# Patient Record
Sex: Female | Born: 1970 | Race: Black or African American | Hispanic: No | Smoking: Never smoker
Health system: Southern US, Community
[De-identification: ages and names within clinical notes are randomized; demographics above are authoritative.]

## PROBLEM LIST (undated history)

## (undated) DIAGNOSIS — IMO0002 Reserved for concepts with insufficient information to code with codable children: Secondary | ICD-10-CM

## (undated) DIAGNOSIS — D649 Anemia, unspecified: Secondary | ICD-10-CM

## (undated) DIAGNOSIS — R87619 Unspecified abnormal cytological findings in specimens from cervix uteri: Secondary | ICD-10-CM

## (undated) DIAGNOSIS — I10 Essential (primary) hypertension: Secondary | ICD-10-CM

## (undated) HISTORY — PX: TONSILLECTOMY: SUR1361

## (undated) HISTORY — PX: KNEE SURGERY: SHX244

## (undated) HISTORY — DX: Anemia, unspecified: D64.9

## (undated) HISTORY — PX: FINGER SURGERY: SHX640

## (undated) HISTORY — PX: FOOT SURGERY: SHX648

---

## 1997-07-13 ENCOUNTER — Ambulatory Visit (HOSPITAL_COMMUNITY): Admission: RE | Admit: 1997-07-13 | Discharge: 1997-07-13 | Payer: Self-pay | Admitting: *Deleted

## 1997-08-03 ENCOUNTER — Ambulatory Visit (HOSPITAL_COMMUNITY): Admission: RE | Admit: 1997-08-03 | Discharge: 1997-08-03 | Payer: Self-pay | Admitting: *Deleted

## 1997-11-11 ENCOUNTER — Inpatient Hospital Stay (HOSPITAL_COMMUNITY): Admission: AD | Admit: 1997-11-11 | Discharge: 1997-11-14 | Payer: Self-pay | Admitting: *Deleted

## 1999-01-18 ENCOUNTER — Emergency Department (HOSPITAL_COMMUNITY): Admission: EM | Admit: 1999-01-18 | Discharge: 1999-01-18 | Payer: Self-pay | Admitting: *Deleted

## 1999-02-26 ENCOUNTER — Emergency Department (HOSPITAL_COMMUNITY): Admission: EM | Admit: 1999-02-26 | Discharge: 1999-02-26 | Payer: Self-pay | Admitting: Emergency Medicine

## 1999-02-27 ENCOUNTER — Emergency Department (HOSPITAL_COMMUNITY): Admission: EM | Admit: 1999-02-27 | Discharge: 1999-02-27 | Payer: Self-pay | Admitting: Emergency Medicine

## 1999-05-05 ENCOUNTER — Emergency Department (HOSPITAL_COMMUNITY): Admission: EM | Admit: 1999-05-05 | Discharge: 1999-05-05 | Payer: Self-pay

## 2000-01-24 ENCOUNTER — Emergency Department (HOSPITAL_COMMUNITY): Admission: EM | Admit: 2000-01-24 | Discharge: 2000-01-24 | Payer: Self-pay | Admitting: Emergency Medicine

## 2000-02-22 ENCOUNTER — Emergency Department (HOSPITAL_COMMUNITY): Admission: EM | Admit: 2000-02-22 | Discharge: 2000-02-22 | Payer: Self-pay | Admitting: *Deleted

## 2000-04-12 ENCOUNTER — Inpatient Hospital Stay (HOSPITAL_COMMUNITY): Admission: AD | Admit: 2000-04-12 | Discharge: 2000-04-12 | Payer: Self-pay | Admitting: *Deleted

## 2000-06-14 ENCOUNTER — Inpatient Hospital Stay (HOSPITAL_COMMUNITY): Admission: AD | Admit: 2000-06-14 | Discharge: 2000-06-14 | Payer: Self-pay | Admitting: *Deleted

## 2000-07-25 ENCOUNTER — Ambulatory Visit (HOSPITAL_COMMUNITY): Admission: RE | Admit: 2000-07-25 | Discharge: 2000-07-25 | Payer: Self-pay | Admitting: *Deleted

## 2000-07-25 ENCOUNTER — Encounter: Payer: Self-pay | Admitting: *Deleted

## 2000-11-21 ENCOUNTER — Encounter (INDEPENDENT_AMBULATORY_CARE_PROVIDER_SITE_OTHER): Payer: Self-pay | Admitting: *Deleted

## 2000-11-21 ENCOUNTER — Inpatient Hospital Stay (HOSPITAL_COMMUNITY): Admission: AD | Admit: 2000-11-21 | Discharge: 2000-11-24 | Payer: Self-pay | Admitting: Obstetrics and Gynecology

## 2000-11-25 ENCOUNTER — Encounter: Admission: RE | Admit: 2000-11-25 | Discharge: 2000-12-25 | Payer: Self-pay | Admitting: Obstetrics and Gynecology

## 2000-12-26 ENCOUNTER — Encounter: Admission: RE | Admit: 2000-12-26 | Discharge: 2001-01-25 | Payer: Self-pay | Admitting: Obstetrics and Gynecology

## 2001-01-26 ENCOUNTER — Encounter: Admission: RE | Admit: 2001-01-26 | Discharge: 2001-02-25 | Payer: Self-pay | Admitting: Obstetrics and Gynecology

## 2001-03-28 ENCOUNTER — Encounter: Admission: RE | Admit: 2001-03-28 | Discharge: 2001-04-27 | Payer: Self-pay | Admitting: Obstetrics and Gynecology

## 2001-05-28 ENCOUNTER — Encounter: Admission: RE | Admit: 2001-05-28 | Discharge: 2001-06-27 | Payer: Self-pay | Admitting: Obstetrics and Gynecology

## 2001-06-28 ENCOUNTER — Encounter: Admission: RE | Admit: 2001-06-28 | Discharge: 2001-07-28 | Payer: Self-pay | Admitting: Obstetrics and Gynecology

## 2001-07-08 ENCOUNTER — Emergency Department (HOSPITAL_COMMUNITY): Admission: EM | Admit: 2001-07-08 | Discharge: 2001-07-08 | Payer: Self-pay

## 2001-07-10 ENCOUNTER — Emergency Department (HOSPITAL_COMMUNITY): Admission: EM | Admit: 2001-07-10 | Discharge: 2001-07-11 | Payer: Self-pay | Admitting: Emergency Medicine

## 2001-07-11 ENCOUNTER — Encounter: Payer: Self-pay | Admitting: Emergency Medicine

## 2001-07-11 ENCOUNTER — Inpatient Hospital Stay (HOSPITAL_COMMUNITY): Admission: EM | Admit: 2001-07-11 | Discharge: 2001-07-13 | Payer: Self-pay | Admitting: Cardiovascular Disease

## 2001-08-26 ENCOUNTER — Encounter: Admission: RE | Admit: 2001-08-26 | Discharge: 2001-09-25 | Payer: Self-pay | Admitting: Obstetrics and Gynecology

## 2001-09-11 ENCOUNTER — Encounter: Admission: RE | Admit: 2001-09-11 | Discharge: 2001-12-10 | Payer: Self-pay | Admitting: Orthopedic Surgery

## 2003-06-25 ENCOUNTER — Emergency Department (HOSPITAL_COMMUNITY): Admission: EM | Admit: 2003-06-25 | Discharge: 2003-06-25 | Payer: Self-pay | Admitting: Emergency Medicine

## 2004-04-20 ENCOUNTER — Emergency Department (HOSPITAL_COMMUNITY): Admission: EM | Admit: 2004-04-20 | Discharge: 2004-04-20 | Payer: Self-pay

## 2004-08-25 ENCOUNTER — Ambulatory Visit (HOSPITAL_COMMUNITY): Admission: RE | Admit: 2004-08-25 | Discharge: 2004-08-25 | Payer: Self-pay | Admitting: Gastroenterology

## 2004-10-22 ENCOUNTER — Emergency Department (HOSPITAL_COMMUNITY): Admission: EM | Admit: 2004-10-22 | Discharge: 2004-10-22 | Payer: Self-pay | Admitting: Emergency Medicine

## 2004-12-18 ENCOUNTER — Emergency Department (HOSPITAL_COMMUNITY): Admission: EM | Admit: 2004-12-18 | Discharge: 2004-12-19 | Payer: Self-pay | Admitting: Emergency Medicine

## 2004-12-20 ENCOUNTER — Emergency Department (HOSPITAL_COMMUNITY): Admission: EM | Admit: 2004-12-20 | Discharge: 2004-12-20 | Payer: Self-pay | Admitting: Emergency Medicine

## 2009-02-03 ENCOUNTER — Emergency Department (HOSPITAL_COMMUNITY): Admission: EM | Admit: 2009-02-03 | Discharge: 2009-02-03 | Payer: Self-pay | Admitting: Emergency Medicine

## 2009-07-16 ENCOUNTER — Inpatient Hospital Stay (HOSPITAL_COMMUNITY): Admission: AD | Admit: 2009-07-16 | Discharge: 2009-07-16 | Payer: Self-pay | Admitting: Obstetrics & Gynecology

## 2009-07-16 ENCOUNTER — Ambulatory Visit: Payer: Self-pay | Admitting: Family

## 2009-11-16 ENCOUNTER — Inpatient Hospital Stay (HOSPITAL_COMMUNITY): Admission: AD | Admit: 2009-11-16 | Discharge: 2009-11-16 | Payer: Self-pay | Admitting: Obstetrics & Gynecology

## 2009-12-24 ENCOUNTER — Ambulatory Visit: Payer: Self-pay | Admitting: Family

## 2009-12-24 ENCOUNTER — Inpatient Hospital Stay (HOSPITAL_COMMUNITY): Admission: AD | Admit: 2009-12-24 | Discharge: 2009-12-24 | Payer: Self-pay | Admitting: Obstetrics and Gynecology

## 2010-01-05 ENCOUNTER — Ambulatory Visit: Payer: Self-pay | Admitting: Gynecology

## 2010-01-05 ENCOUNTER — Inpatient Hospital Stay (HOSPITAL_COMMUNITY): Admission: AD | Admit: 2010-01-05 | Discharge: 2010-01-05 | Payer: Self-pay | Admitting: Obstetrics & Gynecology

## 2010-01-31 ENCOUNTER — Inpatient Hospital Stay (HOSPITAL_COMMUNITY): Admission: AD | Admit: 2010-01-31 | Discharge: 2010-01-31 | Payer: Self-pay | Admitting: Obstetrics & Gynecology

## 2010-01-31 ENCOUNTER — Ambulatory Visit: Payer: Self-pay | Admitting: Nurse Practitioner

## 2010-03-01 ENCOUNTER — Encounter: Admission: RE | Admit: 2010-03-01 | Discharge: 2010-03-01 | Payer: Self-pay | Admitting: Family Medicine

## 2010-03-06 ENCOUNTER — Inpatient Hospital Stay (HOSPITAL_COMMUNITY): Admission: AD | Admit: 2010-03-06 | Discharge: 2010-03-06 | Payer: Self-pay | Admitting: Obstetrics & Gynecology

## 2010-03-07 ENCOUNTER — Ambulatory Visit: Payer: Self-pay | Admitting: Obstetrics and Gynecology

## 2010-03-09 ENCOUNTER — Ambulatory Visit (HOSPITAL_COMMUNITY): Admission: RE | Admit: 2010-03-09 | Discharge: 2010-03-09 | Payer: Self-pay | Admitting: Obstetrics & Gynecology

## 2010-04-06 ENCOUNTER — Ambulatory Visit: Payer: Self-pay | Admitting: Obstetrics and Gynecology

## 2010-04-27 ENCOUNTER — Ambulatory Visit: Payer: Self-pay | Admitting: Obstetrics and Gynecology

## 2010-04-28 ENCOUNTER — Encounter (INDEPENDENT_AMBULATORY_CARE_PROVIDER_SITE_OTHER): Payer: Self-pay | Admitting: *Deleted

## 2010-04-28 LAB — CONVERTED CEMR LAB: WBC, Wet Prep HPF POC: NONE SEEN

## 2010-05-22 ENCOUNTER — Encounter: Payer: Self-pay | Admitting: Family Medicine

## 2010-06-06 ENCOUNTER — Ambulatory Visit (INDEPENDENT_AMBULATORY_CARE_PROVIDER_SITE_OTHER): Payer: Self-pay | Admitting: Obstetrics & Gynecology

## 2010-06-06 DIAGNOSIS — Z01419 Encounter for gynecological examination (general) (routine) without abnormal findings: Secondary | ICD-10-CM

## 2010-06-07 ENCOUNTER — Encounter (INDEPENDENT_AMBULATORY_CARE_PROVIDER_SITE_OTHER): Payer: Self-pay | Admitting: *Deleted

## 2010-06-07 LAB — CONVERTED CEMR LAB: Yeast Wet Prep HPF POC: NONE SEEN

## 2010-07-12 LAB — URINALYSIS, ROUTINE W REFLEX MICROSCOPIC
Bilirubin Urine: NEGATIVE
Nitrite: NEGATIVE
Specific Gravity, Urine: >1.03 — ABNORMAL HIGH (ref 1.005–1.030)
Urobilinogen, UA: 0.2 mg/dL (ref 0.0–1.0)
pH: 6 (ref 5.0–8.0)

## 2010-07-12 LAB — WET PREP, GENITAL: Clue Cells Wet Prep HPF POC: NONE SEEN

## 2010-07-12 LAB — URINE MICROSCOPIC-ADD ON

## 2010-07-13 LAB — URINALYSIS, ROUTINE W REFLEX MICROSCOPIC
Glucose, UA: NEGATIVE mg/dL
Ketones, ur: NEGATIVE mg/dL
Protein, ur: NEGATIVE mg/dL
Urobilinogen, UA: 0.2 mg/dL (ref 0.0–1.0)

## 2010-07-13 LAB — GC/CHLAMYDIA PROBE AMP, GENITAL: Chlamydia, DNA Probe: NEGATIVE

## 2010-07-13 LAB — WET PREP, GENITAL: Yeast Wet Prep HPF POC: NONE SEEN

## 2010-07-13 LAB — URINE MICROSCOPIC-ADD ON

## 2010-07-14 LAB — WET PREP, GENITAL
Clue Cells Wet Prep HPF POC: NONE SEEN
Yeast Wet Prep HPF POC: NONE SEEN

## 2010-07-14 LAB — GC/CHLAMYDIA PROBE AMP, GENITAL: Chlamydia, DNA Probe: NEGATIVE

## 2010-07-16 LAB — URINALYSIS, ROUTINE W REFLEX MICROSCOPIC
Glucose, UA: NEGATIVE mg/dL
Hgb urine dipstick: NEGATIVE
Protein, ur: NEGATIVE mg/dL
Specific Gravity, Urine: 1.025 (ref 1.005–1.030)

## 2010-07-16 LAB — WET PREP, GENITAL
Trich, Wet Prep: NONE SEEN
Yeast Wet Prep HPF POC: NONE SEEN

## 2010-07-16 LAB — POCT PREGNANCY, URINE: Preg Test, Ur: NEGATIVE

## 2010-07-17 ENCOUNTER — Inpatient Hospital Stay (HOSPITAL_COMMUNITY)
Admission: AD | Admit: 2010-07-17 | Discharge: 2010-07-17 | Disposition: A | Discharge status: Home / Self Care | Payer: Medicaid Other | Source: Ambulatory Visit | Attending: Obstetrics & Gynecology | Admitting: Obstetrics & Gynecology

## 2010-07-17 DIAGNOSIS — B3731 Acute candidiasis of vulva and vagina: Secondary | ICD-10-CM | POA: Insufficient documentation

## 2010-07-17 DIAGNOSIS — N949 Unspecified condition associated with female genital organs and menstrual cycle: Secondary | ICD-10-CM

## 2010-07-17 DIAGNOSIS — B373 Candidiasis of vulva and vagina: Secondary | ICD-10-CM

## 2010-07-22 LAB — URINALYSIS, ROUTINE W REFLEX MICROSCOPIC
Glucose, UA: NEGATIVE mg/dL
Leukocytes, UA: NEGATIVE
Specific Gravity, Urine: >1.03 — ABNORMAL HIGH (ref 1.005–1.030)
pH: 6 (ref 5.0–8.0)

## 2010-07-22 LAB — URINE MICROSCOPIC-ADD ON

## 2010-07-22 LAB — GC/CHLAMYDIA PROBE AMP, GENITAL: Chlamydia, DNA Probe: NEGATIVE

## 2010-07-22 LAB — WET PREP, GENITAL

## 2010-07-22 NOTE — Progress Notes (Signed)
[name], [name]            ACCOUNT NO.:  [id number]  MEDICAL RECORD NO.:  [id number]           PATIENT TYPE:  A  LOCATION:  [facility]                   FACILITY:  [facility]  PHYSICIAN:  Maryelizabeth Kaufmann, MD  DATE OF BIRTH:  06/27/1970  DATE OF SERVICE:  06/06/2010                                 CLINIC NOTE  CHIEF COMPLAINT:  "Yeast infection symptoms."  HISTORY OF PRESENT ILLNESS:  This is a 40 year old gravida 4, para 4-0- 04, who presents with this intermittent complaint of vaginal itching without any associated discharge or odor.  The patient denies any recent sexual activity per her last clinic visit in December.  She states that she would have some of these symptoms and intermittent vulvar pruritus with the use of latex condoms, however, she says she has not had any sexual activity since December.  She denies any environmental changes such as change in lotion, soap, fabric softener, etc.  PHYSICAL EXAMINATION:  VITAL SIGNS:  Blood pressure 166/83, pulse 55, temperature 97.1, weight 169.5. GENERAL:  The patient is in no acute distress, alert, and oriented x4. GU:  Normal external genitalia.  No evidence of any erythema or marked pruritus or excoriations.  Sterile speculum exam was normal vaginal mucosa.  Cervix appeared to be normal.  No visualized lesions, closed. There was some scant discharge without any associated odor, appeared to be physiologic possibly due to a wet prep.  ASSESSMENT AND PLAN:  This is a 40 year old gravida 4, para 4-0-0-4 presenting with intermittent pruritus and vaginal discharge. Wet prep was done.  The patient was informed that this may very well be physiologic and we will accomplish her with any abnormal results and we did not do any empiric treatment due to the lack of clinical suspicion for any type of yeast infection.  There was no thick white discharge or any other concerning symptoms for that.  If her wet prep returns negative, patient  to return to clinic in June for her next well-woman checkup.          ______________________________ Maryelizabeth Kaufmann, MD    LC/MEDQ  D:  06/06/2010  T:  06/07/2010  Job:  [id number]

## 2010-08-03 ENCOUNTER — Inpatient Hospital Stay (HOSPITAL_COMMUNITY)
Admission: AD | Admit: 2010-08-03 | Discharge: 2010-08-03 | Disposition: A | Discharge status: Home / Self Care | Payer: Medicaid Other | Source: Ambulatory Visit | Attending: Obstetrics & Gynecology | Admitting: Obstetrics & Gynecology

## 2010-08-03 DIAGNOSIS — N949 Unspecified condition associated with female genital organs and menstrual cycle: Secondary | ICD-10-CM | POA: Insufficient documentation

## 2010-08-03 DIAGNOSIS — B373 Candidiasis of vulva and vagina: Secondary | ICD-10-CM | POA: Insufficient documentation

## 2010-08-03 DIAGNOSIS — B3731 Acute candidiasis of vulva and vagina: Secondary | ICD-10-CM | POA: Insufficient documentation

## 2010-08-03 LAB — WET PREP, GENITAL
Trich, Wet Prep: NONE SEEN
Yeast Wet Prep HPF POC: NONE SEEN

## 2010-08-04 LAB — WET PREP, GENITAL: Trich, Wet Prep: NONE SEEN

## 2010-08-04 LAB — URINALYSIS, ROUTINE W REFLEX MICROSCOPIC
Bilirubin Urine: NEGATIVE
Glucose, UA: NEGATIVE mg/dL
Hgb urine dipstick: NEGATIVE
Specific Gravity, Urine: 1.027 (ref 1.005–1.030)
Urobilinogen, UA: 0.2 mg/dL (ref 0.0–1.0)

## 2010-08-04 LAB — GC/CHLAMYDIA PROBE AMP, GENITAL
Chlamydia, DNA Probe: NEGATIVE
GC Probe Amp, Genital: NEGATIVE

## 2010-09-16 NOTE — Op Note (Signed)
[facility]  Patient:    [name], [name] Visit Number: [id number] MRN: [id number]          Service Type: OBS Location: 910A 9133 [id number] Attending Physician:  Wandalee Ferdinand Dictated by:   Rudy Jew Ashley Royalty, M.D. Proc. Date: 11/21/00 Admit Date:  11/21/2000 Discharge Date: 11/24/2000                             Operative Report  NOTE:                         This is being dictated after the fact, as no previous dictation has been found by transcription.  The procedure was performed over two months ago.  PREOPERATIVE DIAGNOSES:       1. Intrauterine pregnancy at 39 weeks and 2                                  days gestation.                               2. Previous cesarean section.                               3. Desire for attempt at [facility] surgical                                  sterilization.  POSTOPERATIVE DIAGNOSES:      1. Intrauterine pregnancy at 39 weeks and 2                                  days gestation.                               2. Previous cesarean section.                               3. Desire for attempt at [facility] surgical                                  sterilization.  PROCEDURES:                   1. Repeat low transverse cesarean section.                               2. Bilateral tubal sterilization procedure (left                                  [place], right [place]).  SURGEON:                      Rudy Jew. Ashley Royalty, M.D.  ANESTHESIA:                   Spinal.  FINDINGS:  A 7 lb 5 oz female.  Apgars 8 at one minute and 9 at five minutes.  Sent to the newborn nursery.  Arterial cord pH 7.34.  ESTIMATED BLOOD LOSS:         600 cc.  COMPLICATIONS:                None.  PACKS AND DRAINS:             None.  SPONGE, NEEDLE AND INSTRUMENT COUNTS:            Reported as correct x 2.  DESCRIPTION OF PROCEDURE:     The patient was taken to the operating room and placed in the  sitting position.  After adequate spinal anesthesia was administered, she was placed in the dorsal supine position, prepped and draped in the usual manner for abdominal surgery.  A Foley catheter was placed.  A Pfannenstiel incision was made down to the level of the fascia.  The fascia was then nicked with a knife and incised transversely with Mayo scissors.  The underlying rectus muscles were separated from the overlying fascia using sharp and blunt dissection.  The rectus muscles were separated in the midline, exposing the peritoneum, which was elevated with hemostats and entered atraumatically with Metzenbaum scissors.  The incision was extended longitudinally.  The uterus was identified and a bladder flap created by incising the anterior uterine serosa and sharply and bluntly dissecting the bladder inferiorly.  It was held in place with a bladder blade.  The uterus was then entered with a combination of sharp and blunt dissection.  The infant was delivered from the vertex presentation in an atraumatic manner.  The infant was suctioned.  The cord was doubly clamped, cut and the infant given immediately to the awaiting pediatrics team.  The placenta and membranes were delivered in their entirety and submitted to pathology.  The uterus was then exteriorized.  The uterus was then closed in two running layers of #1 Vicryl. The first was a running locking layer.  The second was a running, intermittently locking and imbricating layer.  Hemostasis was noted.  Attention was then turned to the tubal sterilization procedure.  The left fallopian tube was grasped with a Babcock clamp.  The mesosalpinx was opened and two #1 plain catgut sutures were used to ligate a portion in the ampullary region.  The intervening portion was completely excised and was submitted to pathology for histologic studies.  Hemostasis was noted.  The right fallopian tube was then grasped with a Babcock clamp.  An avascular  area in the ampullary portion was chosen for [employer] ligation.  A #1 plain catgut was utilized.  A second suture were placed, as well.  The intervening portion of fallopian tube was excised with Metzenbaum scissors and submitted to pathology for histologic studies.  Hemostasis was noted.  The uterus, tubes and ovaries were noted to be otherwise normal and returned to the abdominal cavity. Copious irrigation was accomplished.  The fascia was then closed with 0 Vicryl in a running fashion.  The skin was closed with staples.  The patient tolerated the procedure extremely well and was returned to the recovery room in good condition. Dictated by:   Rudy Jew Ashley Royalty, M.D. Attending Physician:  Wandalee Ferdinand DD:  02/24/01 TD:  02/26/01 Job: [id number]

## 2010-09-16 NOTE — Discharge Summary (Signed)
[facility]  Patient:    [name], [name] Visit Number: [id number] MRN: [id number]          Service Type: OBS Location: 910A 9133 01 Attending Physician:  Wandalee Ferdinand Dictated by:   Rudy Jew Ashley Royalty, M.D. Admit Date:  11/21/2000 Discharge Date: 11/24/2000                             Discharge Summary  DISCHARGE DIAGNOSES:          1. Intrauterine pregnancy at 39+ weeks                                  gestation, delivered.                               2. Previous cesarean section.                               3. Late transfer care at [facility].                               4. Desire for repeat cesarean section.                               5. Desire for attempt at permanent surgical                                  sterilization.                               6. Term birth living child.  OPERATIONS AND SPECIAL PROCEDURES:           1. Repeat low transverse cesarean section.                               2. Bilateral tubal sterilization procedure                                  (left--[place], right--[place]).  CONSULTATIONS:                None.  DISCHARGE MEDICATIONS:        1. Tylox one p.o. q.3-4h. p.r.n. pain.                               2. Ampicillin 500 mg q.i.d.                               3. Chromagen one p.o. q.d.  HISTORY AND PHYSICAL:         This is a 40 year old, gravida 4, para 3, with estimated date of confinement November 26, 2000, 39 weeks 2 days gestation by dates. Prenatal care was complicated by late transfer to [facility]. The patient stated a desire for repeat cesarean section  and tubal sterilization procedure.  HOSPITAL COURSE:              The patient was admitted to [facility]. Initial laboratory studies were drawn. On November 21, 2000 she was taken to the operating room and underwent repeat low transverse cesarean section and bilateral tubal sterilization  procedure. The procedure yielded a 7-pound 5-ounce female, Apgars 8 at one minute, 9 at five minutes and sent to newborn nursery. The patients postoperative course was complicated by an anemia which was attributed to blood loss. She also had a low-grade fever for which there was no definite focus identified. She was given antibiotics in the hospital as well as at the time of discharge. The hemoglobin was noted to be stabilized prior to discharge as well. She was discharged on November 24, 2000 afebrile and in satisfactory condition.  ACCESSORY CLINICAL FINDINGS:  Hemoglobin and hematocrit on admission were 11.0 and 31.0, respectively. Repeat values were obtained on November 22, 2000 and were 9.1 and 25.7, respectively. Hemoglobin on November 23, 2000 and November 24, 2000 was 7.4, respectively. Type and Rh revealed B positive blood. Blood cultures were obtained and revealed no growth. RPR was nonreactive.  DISPOSITION:                  The patient is to return to [facility] in four to six weeks for postoperative and postpartum evaluation. She is also to return November 26, 2000 for CBC. Dictated by:   Rudy Jew Ashley Royalty, M.D. Attending Physician:  Wandalee Ferdinand DD:  01/03/01 TD:  01/03/01 Job: [id number]

## 2010-09-16 NOTE — Consult Note (Signed)
[place]  Patient:    [name], [name] Visit Number: [id number] MRN: [id number]          Service Type: MED Location: [phone] Attending Physician:  Virgina Evener Dictated by:   Doylene Canning. Ladona Ridgel, M.D. [facility] Proc. Date: 07/11/01 Admit Date:  07/11/2001 Discharge Date: 07/13/2001   CC:         Runell Gess, M.D.  Kathrine Cords, [facility]   Consultation Report  REFERRING PHYSICIAN:  Runell Gess, M.D.  REASON FOR CONSULTATION:  Evaluation of long QT syndrome.  HISTORY OF PRESENT ILLNESS:  The patient is a very pleasant 40 year old woman with no significant past medical history, who was admitted to the hospital after experiencing palpitations and near syncope. This was in conjunction with a markedly prolonged QT. The patients initial problem began several days ago when she experienced recurrent nausea and vomiting. She went to the [facility] where she was treated with IV fluids and subsequently given Phenergan, initially IV, and then prescription strength p.o. dosing. Two days later she developed recurrent palpitations and near syncope. This occurred on multiple occasions such that she finally presented to the emergency room where she had a corrected QT interval of over 600 msec. She was admitted for additional evaluation.  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 She presently denies using cocaine. Her urine drug screen is presently pending.  FAMILY HISTORY:  Notable for an uncle and a cousin who died suddenly of unclear etiology. The uncle was 41, the cousin was 59. Both were female.  SOCIAL HISTORY:  As previously noted. She also smokes 1/4 to 1/2 packs of cigarettes a day, and drinks 3-4 beers per week.  REVIEW OF SYSTEMS:  Otherwise unremarkable. She denies any hearing or vision problems. She denies any chest pain or  shortness of breath. She denies any diarrhea or constipation. She had nausea and vomiting as previously noted. She denies any headaches. She denies any skin changes. She denies any chest pain, shortness of breath, PND, or orthopnea. She does have presyncope and palpitations as previously noted. She denies any arthritic symptoms. She denies any urinary frequency, urgency, or dysuria. She denies hematuria. She denies any weakness, numbness, or depression. She denies polyuria or polydipsia.  PHYSICAL EXAMINATION:  GENERAL:  She is a pleasant well-appearing young woman in no distress.  VITAL SIGNS:  Blood pressure today 110/64, pulse 50 and regular, weight 95 pounds, temperature 98.  HEENT:  Normocephalic, atraumatic. The pupils are equal and round. Oropharynx was moist.  NECK:  No jugular venous distention. The thyroid was not appreciably enlarged. The trachea was midline.  LUNGS:  Clear bilaterally to auscultation. There were no rales or wheezes.  CARDIOVASCULAR:  Regular bradycardia with normal S1 and S2.  ABDOMEN:  Soft, nontender, nondistended. There was no organomegaly appreciated. Bowel sounds were present.  EXTREMITIES:  No cyanosis, clubbing, or edema. Her joints demonstrated no effusions or erythema.  DIAGNOSTIC DATA:  EKG demonstrated sinus bradycardia with prolongation of the QT interval.  IMPRESSION: 1. 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 2. Bradycardia. 3. Hypokalemia with admitting potassium of 3.3.  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 The fact that she has distant relatives who died suddenly is concerning, although there are no first-degree relatives dying suddenly. I suspect the patients prolonged QT and  palpitations were secondary to the Phenergan that she received which unmasked her syndrome. For now I would recommend continued observation and daily EKGs. Off Phenergan her QT  prolongation should improve. I would also recommend low dose beta blocker if her heart rate will tolerate it. Will plan to follow her with you. Implantation of an implantable loop recorder would be a consideration in this particular patient. Dictated by:   Doylene Canning. Ladona Ridgel, M.D. [facility] Attending Physician:  Virgina Evener DD:  07/11/01 TD:  07/13/01 Job: [id number]

## 2010-09-16 NOTE — H&P (Signed)
[facility]  Patient:    [name], [name]                     MRN: [id number] Adm. Date:  11/21/00 Attending:  Fayrene Fearing A. Ashley Royalty, M.D.                         History and Physical  HISTORY:                      This is a 40 year old gravida 4, para 3 EDC November 26, 2000 at 39 weeks 2 days by dates.  Prenatal care was complicated by late transfer to [facility].  Patient states she had an AFP done which the records showed to be normal.  However, she had no one hour p.c.  She has had two previous cesarean sections.  The first was for failure to progress by Dr. Wiliam Ke.  The second one apparently a repeat cesarean section by Dr. Gaynell Face. Patient states her prenatal care has been otherwise unremarkable.  She states the desire for attempt at permanent surgical sterilization.  MEDICATIONS:                  Vitamins.  PAST MEDICAL HISTORY:         Positive for migraine headaches.  PAST SURGICAL HISTORY:        Tonsillectomy, orthopedic surgery, cesarean section x 2.  ALLERGIES:                    None.  FAMILY HISTORY:               Noncontributory.  SOCIAL HISTORY:               Patient denies use of tobacco or significant alcohol.  REVIEW OF SYSTEMS:            Noncontributory.  PHYSICAL EXAMINATION  GENERAL:                      Well-developed, well-nourished, pleasant black female in no acute distress.  VITAL SIGNS:                  Afebrile.  Vital signs stable.  SKIN:                         Warm and dry without lesions.  LYMPH:                        No supraclavicular, cervical, or inguinal adenopathy.  HEENT:                        Normocephalic.  NECK:                         Supple without thyromegaly.  CHEST:                        Lungs are clear.  CARDIAC:                      Regular rate and rhythm without murmurs, gallops, or rubs.  BREASTS:                      Deferred.  ABDOMEN:  Gravid  with a term fundal height.  Fetal heart tones were auscultated with the Doppler.  MUSCULOSKELETAL:              Full range of motion without edema, cyanosis, or CVA tenderness.  PELVIC:                       Deferred until examination under anesthesia.  IMPRESSION:                   1. Intrauterine pregnancy at 39 weeks 2 days                                  gestation.                               2. Previous cesarean section x 2.                               3. Late transfer to [facility].                               4. Desire for repeat cesarean section.                               5. Desire for attempt at permanent surgical                                  sterilization.  PLAN:                         1. Repeat low transverse cesarean section.                               2. Bilateral tubal sterilization procedure.                                  Risks, benefits, complications, and                                  alternatives were fully discussed with the                                  patient.  Permanency and failure rates of                                  tubal sterilization procedure discussed and                                  accepted.  Questions invited and answered.DD: 11/21/00 TD:  11/21/00 Job: [id number]

## 2010-09-26 ENCOUNTER — Inpatient Hospital Stay (HOSPITAL_COMMUNITY)
Admission: AD | Admit: 2010-09-26 | Discharge: 2010-09-26 | Disposition: A | Discharge status: Home / Self Care | Payer: Medicaid Other | Source: Ambulatory Visit | Attending: Obstetrics and Gynecology | Admitting: Obstetrics and Gynecology

## 2010-09-26 DIAGNOSIS — N949 Unspecified condition associated with female genital organs and menstrual cycle: Secondary | ICD-10-CM | POA: Insufficient documentation

## 2010-09-26 DIAGNOSIS — N390 Urinary tract infection, site not specified: Secondary | ICD-10-CM

## 2010-09-26 DIAGNOSIS — N72 Inflammatory disease of cervix uteri: Secondary | ICD-10-CM

## 2010-09-26 LAB — WET PREP, GENITAL
Clue Cells Wet Prep HPF POC: NONE SEEN
Yeast Wet Prep HPF POC: NONE SEEN

## 2010-09-26 LAB — URINALYSIS, ROUTINE W REFLEX MICROSCOPIC
Bilirubin Urine: NEGATIVE
Glucose, UA: NEGATIVE mg/dL
Hgb urine dipstick: NEGATIVE
Ketones, ur: 15 mg/dL — AB
Protein, ur: NEGATIVE mg/dL
Urobilinogen, UA: 1 mg/dL (ref 0.0–1.0)

## 2010-09-26 LAB — URINE MICROSCOPIC-ADD ON

## 2010-09-27 LAB — GC/CHLAMYDIA PROBE AMP, GENITAL: GC Probe Amp, Genital: NEGATIVE

## 2010-10-04 ENCOUNTER — Inpatient Hospital Stay (HOSPITAL_COMMUNITY)
Admission: AD | Admit: 2010-10-04 | Discharge: 2010-10-04 | Discharge status: Against Medical Advice | Payer: Medicaid Other | Source: Ambulatory Visit | Attending: Obstetrics and Gynecology | Admitting: Obstetrics and Gynecology

## 2010-10-04 DIAGNOSIS — N949 Unspecified condition associated with female genital organs and menstrual cycle: Secondary | ICD-10-CM | POA: Insufficient documentation

## 2010-10-04 LAB — URINALYSIS, ROUTINE W REFLEX MICROSCOPIC
Bilirubin Urine: NEGATIVE
Glucose, UA: NEGATIVE mg/dL
Ketones, ur: NEGATIVE mg/dL
Protein, ur: NEGATIVE mg/dL
Urobilinogen, UA: 0.2 mg/dL (ref 0.0–1.0)

## 2010-10-05 ENCOUNTER — Other Ambulatory Visit: Payer: Self-pay | Admitting: Obstetrics and Gynecology

## 2010-10-05 ENCOUNTER — Ambulatory Visit (INDEPENDENT_AMBULATORY_CARE_PROVIDER_SITE_OTHER): Payer: Medicaid Other | Admitting: Obstetrics and Gynecology

## 2010-10-05 DIAGNOSIS — N898 Other specified noninflammatory disorders of vagina: Secondary | ICD-10-CM

## 2010-10-05 LAB — URINE CULTURE
Colony Count: NO GROWTH
Culture  Setup Time: 201206060207
Culture: NO GROWTH

## 2010-10-05 LAB — POCT URINALYSIS DIP (DEVICE)
Bilirubin Urine: NEGATIVE
Glucose, UA: NEGATIVE mg/dL
Hgb urine dipstick: NEGATIVE
Ketones, ur: NEGATIVE mg/dL
Nitrite: NEGATIVE
Protein, ur: NEGATIVE mg/dL
Specific Gravity, Urine: >=1.03 (ref 1.005–1.030)
Urobilinogen, UA: 0.2 mg/dL (ref 0.0–1.0)
pH: 5.5 (ref 5.0–8.0)

## 2010-10-06 NOTE — Group Therapy Note (Signed)
NAME:  CORYN, MOSSO NO.:  [id number]  MEDICAL RECORD NO.:  [id number]           PATIENT TYPE:  A  LOCATION:  [facility]                   FACILITY:  [facility]  PHYSICIAN:  Argentina Donovan, MD        DATE OF BIRTH:  [birth year]  DATE OF SERVICE:  10/05/2010                                 CLINIC NOTE  The patient is a 40 year old African American female who was in the [facility]U. Maternal uncle thought she had a urinary tract infection, was treated with Sulfa and Zithromax.  She was told that they treated her because they thought she had cervicitis.  On examination, her cervix is far anterior and then behind the cervix and the cul-de-sac is markedly inflamed.  I think they thought this was the cervix.  I am not exactly sure what this is, but we did a wet prep as well as GC Chlamydia smear and I am going to start her on MetroGel and have her come back in a couple of weeks to see if that is cleared up.  It is markedly red area right in the cul-de-sac.          ______________________________ Argentina Donovan, MD    PR/MEDQ  D:  10/05/2010  T:  10/06/2010  Job:  [id number]

## 2010-10-19 ENCOUNTER — Ambulatory Visit: Payer: Medicaid Other | Admitting: Obstetrics and Gynecology

## 2010-11-01 ENCOUNTER — Inpatient Hospital Stay (HOSPITAL_COMMUNITY)
Admission: AD | Admit: 2010-11-01 | Discharge: 2010-11-01 | Disposition: A | Discharge status: Home / Self Care | Payer: Medicaid Other | Source: Ambulatory Visit | Attending: Obstetrics and Gynecology | Admitting: Obstetrics and Gynecology

## 2010-11-01 DIAGNOSIS — D259 Leiomyoma of uterus, unspecified: Secondary | ICD-10-CM | POA: Insufficient documentation

## 2010-11-01 DIAGNOSIS — N949 Unspecified condition associated with female genital organs and menstrual cycle: Secondary | ICD-10-CM | POA: Insufficient documentation

## 2010-11-01 DIAGNOSIS — B373 Candidiasis of vulva and vagina: Secondary | ICD-10-CM | POA: Insufficient documentation

## 2010-11-01 DIAGNOSIS — B3731 Acute candidiasis of vulva and vagina: Secondary | ICD-10-CM | POA: Insufficient documentation

## 2010-11-01 LAB — WET PREP, GENITAL: Yeast Wet Prep HPF POC: NONE SEEN

## 2010-11-09 ENCOUNTER — Ambulatory Visit: Payer: Medicaid Other | Admitting: Obstetrics and Gynecology

## 2010-11-11 ENCOUNTER — Emergency Department (HOSPITAL_COMMUNITY): Payer: Medicaid Other

## 2010-11-11 ENCOUNTER — Emergency Department (HOSPITAL_COMMUNITY)
Admission: EM | Admit: 2010-11-11 | Discharge: 2010-11-11 | Disposition: A | Discharge status: Home / Self Care | Payer: Medicaid Other | Attending: Emergency Medicine | Admitting: Emergency Medicine

## 2010-11-11 DIAGNOSIS — R220 Localized swelling, mass and lump, head: Secondary | ICD-10-CM | POA: Insufficient documentation

## 2010-11-11 DIAGNOSIS — S40019A Contusion of unspecified shoulder, initial encounter: Secondary | ICD-10-CM | POA: Insufficient documentation

## 2010-11-11 DIAGNOSIS — M25519 Pain in unspecified shoulder: Secondary | ICD-10-CM | POA: Insufficient documentation

## 2010-11-11 DIAGNOSIS — R51 Headache: Secondary | ICD-10-CM | POA: Insufficient documentation

## 2010-11-11 DIAGNOSIS — S1093XA Contusion of unspecified part of neck, initial encounter: Secondary | ICD-10-CM | POA: Insufficient documentation

## 2010-11-11 DIAGNOSIS — IMO0002 Reserved for concepts with insufficient information to code with codable children: Secondary | ICD-10-CM | POA: Insufficient documentation

## 2010-11-11 DIAGNOSIS — S0003XA Contusion of scalp, initial encounter: Secondary | ICD-10-CM | POA: Insufficient documentation

## 2010-11-11 DIAGNOSIS — S060X0A Concussion without loss of consciousness, initial encounter: Secondary | ICD-10-CM | POA: Insufficient documentation

## 2011-03-06 ENCOUNTER — Other Ambulatory Visit: Payer: Self-pay | Admitting: Family Medicine

## 2011-03-06 DIAGNOSIS — Z1231 Encounter for screening mammogram for malignant neoplasm of breast: Secondary | ICD-10-CM

## 2011-03-10 ENCOUNTER — Ambulatory Visit
Admission: RE | Admit: 2011-03-10 | Discharge: 2011-03-10 | Disposition: A | Discharge status: Home / Self Care | Payer: Medicaid Other | Source: Ambulatory Visit | Attending: Family Medicine | Admitting: Family Medicine

## 2011-03-10 DIAGNOSIS — Z1231 Encounter for screening mammogram for malignant neoplasm of breast: Secondary | ICD-10-CM

## 2011-04-11 ENCOUNTER — Emergency Department (HOSPITAL_BASED_OUTPATIENT_CLINIC_OR_DEPARTMENT_OTHER)
Admission: EM | Admit: 2011-04-11 | Discharge: 2011-04-11 | Disposition: A | Discharge status: Home / Self Care | Payer: Medicaid Other | Attending: Emergency Medicine | Admitting: Emergency Medicine

## 2011-04-11 ENCOUNTER — Emergency Department (INDEPENDENT_AMBULATORY_CARE_PROVIDER_SITE_OTHER): Payer: Medicaid Other

## 2011-04-11 ENCOUNTER — Encounter (HOSPITAL_BASED_OUTPATIENT_CLINIC_OR_DEPARTMENT_OTHER): Payer: Self-pay

## 2011-04-11 ENCOUNTER — Emergency Department (HOSPITAL_COMMUNITY)
Admission: EM | Admit: 2011-04-11 | Discharge: 2011-04-11 | Discharge status: Against Medical Advice | Payer: Medicaid Other | Attending: Emergency Medicine | Admitting: Emergency Medicine

## 2011-04-11 DIAGNOSIS — R110 Nausea: Secondary | ICD-10-CM | POA: Insufficient documentation

## 2011-04-11 DIAGNOSIS — R109 Unspecified abdominal pain: Secondary | ICD-10-CM | POA: Insufficient documentation

## 2011-04-11 DIAGNOSIS — Z0389 Encounter for observation for other suspected diseases and conditions ruled out: Secondary | ICD-10-CM | POA: Insufficient documentation

## 2011-04-11 DIAGNOSIS — K5289 Other specified noninfective gastroenteritis and colitis: Secondary | ICD-10-CM | POA: Insufficient documentation

## 2011-04-11 LAB — URINALYSIS, ROUTINE W REFLEX MICROSCOPIC
Bilirubin Urine: NEGATIVE
Glucose, UA: NEGATIVE mg/dL
Hgb urine dipstick: NEGATIVE
Nitrite: NEGATIVE
Nitrite: NEGATIVE
Specific Gravity, Urine: 1.016 (ref 1.005–1.030)
Specific Gravity, Urine: 1.021 (ref 1.005–1.030)
Urobilinogen, UA: 0.2 mg/dL (ref 0.0–1.0)
pH: 6.5 (ref 5.0–8.0)
pH: 5.5 (ref 5.0–8.0)

## 2011-04-11 LAB — DIFFERENTIAL
Basophils Absolute: 0 10*3/uL (ref 0.0–0.1)
Lymphocytes Relative: 25 % (ref 12–46)
Lymphs Abs: 1.6 10*3/uL (ref 0.7–4.0)
Neutro Abs: 3.8 10*3/uL (ref 1.7–7.7)

## 2011-04-11 LAB — CBC
HCT: 34.4 % — ABNORMAL LOW (ref 36.0–46.0)
Platelets: 353 10*3/uL (ref 150–400)
RBC: 4.15 MIL/uL (ref 3.87–5.11)
RDW: 13.8 % (ref 11.5–15.5)
WBC: 6.1 10*3/uL (ref 4.0–10.5)

## 2011-04-11 LAB — COMPREHENSIVE METABOLIC PANEL
ALT: 13 U/L (ref 0–35)
AST: 18 U/L (ref 0–37)
Alkaline Phosphatase: 63 U/L (ref 39–117)
CO2: 25 mEq/L (ref 19–32)
Chloride: 100 mEq/L (ref 96–112)
GFR calc non Af Amer: >90 mL/min (ref >90)
Potassium: 3.7 mEq/L (ref 3.5–5.1)
Sodium: 137 mEq/L (ref 135–145)
Total Bilirubin: 0.2 mg/dL — ABNORMAL LOW (ref 0.3–1.2)

## 2011-04-11 LAB — PREGNANCY, URINE: Preg Test, Ur: NEGATIVE

## 2011-04-11 MED ORDER — HYDROCODONE-ACETAMINOPHEN 5-325 MG PO TABS: 1.0000 | ORAL_TABLET | Freq: Four times a day (QID) | ORAL | Status: AC | PRN

## 2011-04-11 MED ORDER — IOHEXOL 300 MG/ML  SOLN
100.0000 mL | Freq: Once | INTRAMUSCULAR | Status: AC | PRN
  Administered 2011-04-11: 100 mL via INTRAVENOUS

## 2011-04-11 MED ORDER — ONDANSETRON HCL 4 MG/2ML IJ SOLN
4.0000 mg | Freq: Once | INTRAMUSCULAR | Status: AC
  Administered 2011-04-11: 4 mg via INTRAVENOUS
  Filled 2011-04-11: qty 2

## 2011-04-11 MED ORDER — SODIUM CHLORIDE 0.9 % IV SOLN
999.0000 mL | INTRAVENOUS | Status: DC
  Administered 2011-04-11: 16:00:00 via INTRAVENOUS

## 2011-04-11 MED ORDER — IOHEXOL 300 MG/ML  SOLN
18.0000 mL | Freq: Once | INTRAMUSCULAR | Status: AC | PRN
  Administered 2011-04-11: 18 mL via ORAL

## 2011-04-11 MED ORDER — NAPROXEN 500 MG PO TABS: 500.0000 mg | ORAL_TABLET | Freq: Two times a day (BID) | ORAL | Status: AC

## 2011-04-11 MED ORDER — HYDROMORPHONE HCL PF 1 MG/ML IJ SOLN
1.0000 mg | Freq: Once | INTRAMUSCULAR | Status: AC
  Administered 2011-04-11: 1 mg via INTRAVENOUS
  Filled 2011-04-11: qty 1

## 2011-04-11 NOTE — ED Notes (Signed)
Call no answer 

## 2011-04-11 NOTE — ED Notes (Signed)
Pt reports abdominal pain and nausea that started Sunday.

## 2011-04-11 NOTE — ED Notes (Signed)
Patient is resting comfortably. Pt reports pain is better.

## 2011-04-11 NOTE — ED Provider Notes (Signed)
History     CSN: [id number] Arrival date & time: 04/11/2011  3:08 PM   First MD Initiated Contact with Patient 04/11/11 1557      Chief Complaint  Patient presents with  . Abdominal Pain  . Nausea    (Consider location/radiation/quality/duration/timing/severity/associated sxs/prior treatment) Patient is a 40 y.o. female presenting with abdominal pain. The history is provided by the patient.  Abdominal Pain The primary symptoms of the illness include abdominal pain. The primary symptoms of the illness do not include fever, vomiting, diarrhea, dysuria, vaginal discharge or vaginal bleeding. The current episode started more than 2 days ago. The onset of the illness was gradual. The problem has been gradually worsening.  The abdominal pain is located in the RLQ. The abdominal pain is exacerbated by movement (not by eating).  Symptoms associated with the illness do not include chills, anorexia, urgency or hematuria.    History reviewed. No pertinent past medical history.  Past Surgical History  Procedure Date  . Tonsillectomy   . Finger surgery   . Knee surgery   . Foot surgery   . Cesarean section     No family history on file.  History  Substance Use Topics  . Smoking status: Never Smoker   . Smokeless tobacco: Not on file  . Alcohol Use: Yes    OB History    Grav Para Term Preterm Abortions TAB SAB Ect Mult Living                  Review of Systems  Constitutional: Negative for fever and chills.  Gastrointestinal: Positive for abdominal pain. Negative for vomiting, diarrhea and anorexia.  Genitourinary: Negative for dysuria, urgency, hematuria, vaginal bleeding and vaginal discharge.  All other systems reviewed and are negative.    Allergies  Phenergan  Home Medications  No current outpatient prescriptions on file.  BP 150/71  Pulse 62  Temp(Src) 98.7 F (37.1 C) (Oral)  Resp 16  Ht 5\' 2"  (1.575 m)  Wt 170 lb (77.111 kg)  BMI 31.09 kg/m2  SpO2 100%   LMP 03/16/2011  Physical Exam  Nursing note and vitals reviewed. Constitutional: She appears well-developed and well-nourished. No distress.  HENT:  Head: Normocephalic and atraumatic.  Right Ear: External ear normal.  Left Ear: External ear normal.  Eyes: Conjunctivae are normal. Right eye exhibits no discharge. Left eye exhibits no discharge. No scleral icterus.  Neck: Neck supple. No tracheal deviation present.  Cardiovascular: Normal rate, regular rhythm and intact distal pulses.   Pulmonary/Chest: Effort normal and breath sounds normal. No stridor. No respiratory distress. She has no wheezes. She has no rales.  Abdominal: Soft. Bowel sounds are normal. She exhibits no distension and no mass. There is tenderness in the right upper quadrant and right lower quadrant. There is no rigidity and no rebound. No hernia.  Musculoskeletal: She exhibits no edema and no tenderness.  Neurological: She is alert. She has normal strength. No sensory deficit. Cranial nerve deficit:  no gross defecits noted. She exhibits normal muscle tone. She displays no seizure activity. Coordination normal.  Skin: Skin is warm and dry. No rash noted.  Psychiatric: She has a normal mood and affect.    ED Course  Procedures (including critical care time)  Labs Reviewed  CBC - Abnormal; Notable for the following:    Hemoglobin 11.6 (*)    HCT 34.4 (*)    All other components within normal limits  COMPREHENSIVE METABOLIC PANEL - Abnormal; Notable for the following:  Glucose, Bld 113 (*)    Total Bilirubin 0.2 (*)    All other components within normal limits  URINALYSIS, ROUTINE W REFLEX MICROSCOPIC  PREGNANCY, URINE  DIFFERENTIAL  LIPASE, BLOOD   Ct Abdomen Pelvis W Contrast  04/11/2011  *RADIOLOGY REPORT*  Clinical Data: Right-sided abdominal pain.  CT ABDOMEN AND PELVIS WITH CONTRAST  Technique:  Multidetector CT imaging of the abdomen and pelvis was performed following the standard protocol during  bolus administration of intravenous contrast.  Contrast: 18mL OMNIPAQUE IOHEXOL 300 MG/ML IV SOLN, OMNIPAQUE IOHEXOL 300 MG/ML IV SOLN  Comparison: None.  Findings: Lung bases are clear.  No effusions.  Heart is normal size.  Liver, spleen, gallbladder, pancreas, adrenals and kidneys are normal.  Appendix is visualized posterior to and medial to the cecum and is normal.  Inflammation and stranding is noted along the lateral aspect of the cecum and proximal ascending colon, best seen on image 53 through 56.  There is a area of central fatty density.  These findings are most suggestive of epiploic appendagitis.  Small bowel is decompressed.  Uterus, adnexa and urinary bladder are unremarkable.  Aorta is normal caliber.  No free fluid, free air or adenopathy.  IMPRESSION: Mild stranding along the anterolateral aspect of the cecum and proximal ascending colon with central fatty density, most compatible with epiploic appendagitis.  Appendix is visualized and is normal, separate from the area of inflammation.  Original Report Authenticated By: Cyndie Chime, M.D.     Diagnosis: Epiploic appendagitis  MDM  Patient without signs of appendicitis on CT scan. It does show inflammation of the epiploic appendage.  Patient will be treated with anti-inflammatory agents and pain medications.        Celene Kras, MD 04/11/11 [id number]

## 2011-04-11 NOTE — ED Notes (Signed)
Pt c/o pain that began Saturday; hurts to walk. Denies vomitting, diarrhea.

## 2011-05-12 ENCOUNTER — Ambulatory Visit (INDEPENDENT_AMBULATORY_CARE_PROVIDER_SITE_OTHER): Payer: Medicaid Other | Admitting: Family Medicine

## 2011-05-12 ENCOUNTER — Encounter: Payer: Self-pay | Admitting: Family Medicine

## 2011-05-12 ENCOUNTER — Other Ambulatory Visit (HOSPITAL_COMMUNITY)
Admission: RE | Admit: 2011-05-12 | Discharge: 2011-05-12 | Disposition: A | Discharge status: Home / Self Care | Payer: Medicaid Other | Source: Ambulatory Visit | Attending: Family Medicine | Admitting: Family Medicine

## 2011-05-12 VITALS — BP 122/81 | HR 69 | Temp 97.9°F | Ht 62.5 in | Wt 169.0 lb

## 2011-05-12 DIAGNOSIS — Z113 Encounter for screening for infections with a predominantly sexual mode of transmission: Secondary | ICD-10-CM | POA: Insufficient documentation

## 2011-05-12 DIAGNOSIS — Z0000 Encounter for general adult medical examination without abnormal findings: Secondary | ICD-10-CM

## 2011-05-12 DIAGNOSIS — Z01419 Encounter for gynecological examination (general) (routine) without abnormal findings: Secondary | ICD-10-CM | POA: Insufficient documentation

## 2011-05-12 DIAGNOSIS — N760 Acute vaginitis: Secondary | ICD-10-CM | POA: Insufficient documentation

## 2011-05-12 DIAGNOSIS — B9689 Other specified bacterial agents as the cause of diseases classified elsewhere: Secondary | ICD-10-CM

## 2011-05-12 DIAGNOSIS — A499 Bacterial infection, unspecified: Secondary | ICD-10-CM

## 2011-05-12 NOTE — Progress Notes (Signed)
  Subjective:     [name] is a 41 y.o. female who presents for evaluation of vaginal itching. The itching started 5-6 months ago.  Occurs a few days prior to menses and a few days following, then gets treatment.  Antibiotics lasts for about 1 week, then returns.  Symptoms have been unchanged since. Aggravating factors: none. Alleviating factors: none. Associated symptoms: none - denies discharge, dysuria, pelvic pain. Risk factors for pelvic/abdominal pain include none.  Menstrual History: OB History    Grav Para Term Preterm Abortions TAB SAB Ect Mult Living   4 4 4       4        Patient's last menstrual period was 05/06/2011.    The following portions of the patient's history were reviewed and updated as appropriate: allergies, current medications, past family history, past medical history, past social history, past surgical history and problem list.   Review of Systems Pertinent items are noted in HPI.    Objective:    BP 122/81  Pulse 69  Temp(Src) 97.9 F (36.6 C) (Oral)  Ht 5' 2.5" (1.588 m)  Wt 169 lb (76.658 kg)  BMI 30.42 kg/m2  LMP 05/06/2011 General:   alert, cooperative and no distress  Abdomen:  soft, non-tender; bowel sounds normal; no masses,  no organomegaly  Pelvis:  Vaginal: normal mucosa without prolapse or lesions and normal rugae Cervix: normal appearance and thin prep PAP obtained Adnexa: normal bimanual exam  Extremities:   extremities normal, atraumatic, no cyanosis or edema  Neurologic:   Alert and oriented x3. Gait normal. Reflexes and motor strength normal and symmetric. Cranial nerves 2-12 and sensation grossly intact.  Psychiatric:   normal mood, behavior, speech, dress, and thought processes     Assessment:    Chronic Vaginitis    Plan:    PAP obtained.  Will preform direct DNA probe for BV.  Recommended probiotics.  If positive, will treat with antibiotics with boric acid.

## 2011-05-12 NOTE — Patient Instructions (Signed)
Bacterial Vaginosis Bacterial vaginosis (BV) is a vaginal infection where the normal balance of bacteria in the vagina is disrupted. The normal balance is then replaced by an overgrowth of certain bacteria. There are several different kinds of bacteria that can cause BV. BV is the most common vaginal infection in women of childbearing age. CAUSES   The cause of BV is not fully understood. BV develops when there is an increase or imbalance of harmful bacteria.   Some activities or behaviors can upset the normal balance of bacteria in the vagina and put women at increased risk including:   Having a new sex partner or multiple sex partners.   Douching.   Using an intrauterine device (IUD) for contraception.   It is not clear what role sexual activity plays in the development of BV. However, women that have never had sexual intercourse are rarely infected with BV.  Women do not get BV from toilet seats, bedding, swimming pools or from touching objects around them.  SYMPTOMS   Grey vaginal discharge.   A fish-like odor with discharge, especially after sexual intercourse.   Itching or burning of the vagina and vulva.   Burning or pain with urination.   Some women have no signs or symptoms at all.  DIAGNOSIS  Your caregiver must examine the vagina for signs of BV. Your caregiver will perform lab tests and look at the sample of vaginal fluid through a microscope. They will look for bacteria and abnormal cells (clue cells), a pH test higher than 4.5, and a positive amine test all associated with BV.  RISKS AND COMPLICATIONS   Pelvic inflammatory disease (PID).   Infections following gynecology surgery.   Developing HIV.   Developing herpes virus.  TREATMENT  Sometimes BV will clear up without treatment. However, all women with symptoms of BV should be treated to avoid complications, especially if gynecology surgery is planned. Female partners generally do not need to be treated. However,  BV may spread between female sex partners so treatment is helpful in preventing a recurrence of BV.   BV may be treated with antibiotics. The antibiotics come in either pill or vaginal cream forms. Either can be used with nonpregnant or pregnant women, but the recommended dosages differ. These antibiotics are not harmful to the baby.   BV can recur after treatment. If this happens, a second round of antibiotics will often be prescribed.   Treatment is important for pregnant women. If not treated, BV can cause a premature delivery, especially for a pregnant woman who had a premature birth in the past. All pregnant women who have symptoms of BV should be checked and treated.   For chronic reoccurrence of BV, treatment with a type of prescribed gel vaginally twice a week is helpful.  HOME CARE INSTRUCTIONS   Finish all medication as directed by your caregiver.   Do not have sex until treatment is completed.   Tell your sexual partner that you have a vaginal infection. They should see their caregiver and be treated if they have problems, such as a mild rash or itching.   Practice safe sex. Use condoms. Only have 1 sex partner.  PREVENTION  Basic prevention steps can help reduce the risk of upsetting the natural balance of bacteria in the vagina and developing BV:  Do not have sexual intercourse (be abstinent).   Do not douche.   Use all of the medicine prescribed for treatment of BV, even if the signs and symptoms go away.     Tell your sex partner if you have BV. That way, they can be treated, if needed, to prevent reoccurrence.  SEEK MEDICAL CARE IF:   Your symptoms are not improving after 3 days of treatment.   You have increased discharge, pain, or fever.  MAKE SURE YOU:   Understand these instructions.   Will watch your condition.   Will get help right away if you are not doing well or get worse.  FOR MORE INFORMATION  Division of STD Prevention (DSTDP), Centers for Disease  Control and Prevention: www.cdc.gov/std American Social Health Association (ASHA): www.ashastd.org  Document Released: 04/17/2005 Document Revised: 12/28/2010 Document Reviewed: 10/08/2008 [employer] Patient Information 2012 [employer]. 

## 2011-05-16 ENCOUNTER — Telehealth: Payer: Self-pay | Admitting: *Deleted

## 2011-05-16 NOTE — Telephone Encounter (Signed)
Pt left message requesting test results from visit on 05/12/11.

## 2011-05-17 NOTE — Telephone Encounter (Signed)
Telephoned pt and advised test results not back yet. It usually takes longer for those results. Pt voiced understanding.

## 2011-05-24 NOTE — Telephone Encounter (Signed)
Called pt informed of the following test results: Pap- normal, GC/Chlamydia- neg, Trich- neg.  Pt voiced understanding.

## 2012-04-01 ENCOUNTER — Other Ambulatory Visit: Payer: Self-pay | Admitting: Family Medicine

## 2012-04-01 DIAGNOSIS — Z1231 Encounter for screening mammogram for malignant neoplasm of breast: Secondary | ICD-10-CM

## 2012-04-12 ENCOUNTER — Inpatient Hospital Stay (HOSPITAL_COMMUNITY)
Admission: AD | Admit: 2012-04-12 | Discharge: 2012-04-12 | Disposition: A | Discharge status: Home / Self Care | Payer: Medicaid Other | Source: Ambulatory Visit | Attending: Obstetrics and Gynecology | Admitting: Obstetrics and Gynecology

## 2012-04-12 DIAGNOSIS — N949 Unspecified condition associated with female genital organs and menstrual cycle: Secondary | ICD-10-CM | POA: Insufficient documentation

## 2012-04-12 DIAGNOSIS — L293 Anogenital pruritus, unspecified: Secondary | ICD-10-CM | POA: Insufficient documentation

## 2012-04-12 NOTE — MAU Note (Signed)
Not in lobby

## 2012-04-12 NOTE — MAU Note (Signed)
Going on the past couple days. Couldn't get into reg dr,  Has a slight d/c with itching.  Last time had that  It was bacterial  Infection.    At top of crack, had an area felt like a boil was going to come up, was itching really bad on Sunday, was scratching it.  Turned red, on Mon- looked like there were pus pockets or blisters.  Put some chlorox on a cute tip, didn't burn or do anything. Put some blue star ointment on it, next day when bathing- it like- "rubbed off", blisters are gone, no longer itching- but you can tell where it was- red spots.

## 2012-04-12 NOTE — MAU Note (Signed)
Patient not in lobby for 3rd call.

## 2012-04-25 ENCOUNTER — Ambulatory Visit: Payer: Medicaid Other | Admitting: Family Medicine

## 2012-05-06 ENCOUNTER — Ambulatory Visit
Admission: RE | Admit: 2012-05-06 | Discharge: 2012-05-06 | Disposition: A | Discharge status: Home / Self Care | Payer: Medicaid Other | Source: Ambulatory Visit | Attending: Family Medicine | Admitting: Family Medicine

## 2012-05-06 DIAGNOSIS — Z1231 Encounter for screening mammogram for malignant neoplasm of breast: Secondary | ICD-10-CM

## 2012-05-28 ENCOUNTER — Inpatient Hospital Stay (HOSPITAL_COMMUNITY)
Admission: AD | Admit: 2012-05-28 | Discharge: 2012-05-28 | Disposition: A | Discharge status: Home / Self Care | Payer: Medicaid Other | Source: Ambulatory Visit | Attending: Family Medicine | Admitting: Family Medicine

## 2012-05-28 ENCOUNTER — Encounter (HOSPITAL_COMMUNITY): Payer: Self-pay | Admitting: *Deleted

## 2012-05-28 DIAGNOSIS — N949 Unspecified condition associated with female genital organs and menstrual cycle: Secondary | ICD-10-CM | POA: Insufficient documentation

## 2012-05-28 DIAGNOSIS — N898 Other specified noninflammatory disorders of vagina: Secondary | ICD-10-CM

## 2012-05-28 LAB — URINALYSIS, ROUTINE W REFLEX MICROSCOPIC
Bilirubin Urine: NEGATIVE
Specific Gravity, Urine: >1.03 — ABNORMAL HIGH (ref 1.005–1.030)
Urobilinogen, UA: 0.2 mg/dL (ref 0.0–1.0)

## 2012-05-28 LAB — WET PREP, GENITAL: Yeast Wet Prep HPF POC: NONE SEEN

## 2012-05-28 LAB — URINE MICROSCOPIC-ADD ON

## 2012-05-28 NOTE — MAU Provider Note (Signed)
History     CSN: [id number]  Arrival date and time: 05/28/12 1048   First Provider Initiated Contact with Patient 05/28/12 1114      Chief Complaint  Patient presents with  . Vaginal Discharge   HPI Ms. [name] is a 42 y.o. [phone] who presents to MAU with complaint of vaginal discharge x 2-3 days. The patient states that it is a thick white discharge. She denies itching, pain, dysuria, fever, N/V. She does not have any concern for STD exposure.   OB History    Grav Para Term Preterm Abortions TAB SAB Ect Mult Living   4 4 4       4       Past Medical History  Diagnosis Date  . Anemia     Past Surgical History  Procedure Date  . Tonsillectomy   . Finger surgery   . Knee surgery   . Foot surgery   . Cesarean section     Family History  Problem Relation Age of Onset  . Hypertension Mother   . Hypertension Father     History  Substance Use Topics  . Smoking status: Never Smoker   . Smokeless tobacco: Not on file  . Alcohol Use: Yes     Comment: weekends    Allergies:  Allergies  Allergen Reactions  . Promethazine Hcl Shortness Of Breath    Prolonged QT on ekg    Prescriptions prior to admission  Medication Sig Dispense Refill  . ibuprofen (ADVIL,MOTRIN) 600 MG tablet Take 600 mg by mouth every 6 (six) hours as needed.        ROS All negative unless otherwise noted in HPI Physical Exam   Blood pressure 123/67, pulse 73, temperature 98.4 F (36.9 C), temperature source Oral, resp. rate 16, height 5' 2.5" (1.588 m), weight 173 lb 9.6 oz (78.744 kg), last menstrual period 05/16/2012.  Physical Exam  Constitutional: She is oriented to person, place, and time. She appears well-developed and well-nourished. No distress.  HENT:  Head: Normocephalic.  Cardiovascular: Normal rate, regular rhythm and normal heart sounds.   Respiratory: Effort normal and breath sounds normal. No respiratory distress.  GI: Soft. Bowel sounds are normal. She  exhibits no distension and no mass. There is no tenderness. There is no rebound and no guarding.  Genitourinary: Vagina normal. Uterus is not enlarged and not tender. Cervix exhibits discharge (thick white discharge noted on cervix and in vaginal vault). Cervix exhibits no motion tenderness and no friability.  Neurological: She is alert and oriented to person, place, and time.  Skin: Skin is warm and dry. No erythema.  Psychiatric: She has a normal mood and affect.   Results for orders placed during the hospital encounter of 05/28/12 (from the past 24 hour(s))  URINALYSIS, ROUTINE W REFLEX MICROSCOPIC     Status: Abnormal   Collection Time   05/28/12 11:00 AM      Component Value Range   Color, Urine YELLOW  YELLOW   APPearance CLEAR  CLEAR   Specific Gravity, Urine >1.030 (*) 1.005 - 1.030   pH 6.0  5.0 - 8.0   Glucose, UA NEGATIVE  NEGATIVE mg/dL   Hgb urine dipstick TRACE (*) NEGATIVE   Bilirubin Urine NEGATIVE  NEGATIVE   Ketones, ur NEGATIVE  NEGATIVE mg/dL   Protein, ur NEGATIVE  NEGATIVE mg/dL   Urobilinogen, UA 0.2  0.0 - 1.0 mg/dL   Nitrite NEGATIVE  NEGATIVE   Leukocytes, UA NEGATIVE  NEGATIVE  URINE  MICROSCOPIC-ADD ON     Status: Abnormal   Collection Time   05/28/12 11:00 AM      Component Value Range   Squamous Epithelial / LPF FEW (*) RARE   RBC / HPF 0-2  <3 RBC/hpf   Bacteria, UA RARE  RARE  POCT PREGNANCY, URINE     Status: Normal   Collection Time   05/28/12 11:08 AM      Component Value Range   Preg Test, Ur NEGATIVE  NEGATIVE  WET PREP, GENITAL     Status: Abnormal   Collection Time   05/28/12 11:20 AM      Component Value Range   Yeast Wet Prep HPF POC NONE SEEN  NONE SEEN   Trich, Wet Prep NONE SEEN  NONE SEEN   Clue Cells Wet Prep HPF POC NONE SEEN  NONE SEEN   WBC, Wet Prep HPF POC FEW (*) NONE SEEN    MAU Course  Procedures None  MDM UA, UPT and Wet prep - negative GC/Chlamydia obtained  Assessment and Plan  A: Physiologic  discharge   P: Discharge home Discussed hygiene and probiotics to avoid BV Patient may return to MAU as needed or if her condition should change or worsen   Freddi Starr, PA-C 05/28/2012, 11:14 AM

## 2012-05-28 NOTE — Progress Notes (Signed)
Written and verbal d/c instructions given and understanding voiced. Raynelle Fanning PA in to discuss lab results and d/c plan

## 2012-05-28 NOTE — Progress Notes (Signed)
States occ. Feels alittle dizzy, like vertigo

## 2012-05-28 NOTE — MAU Note (Signed)
Onset of white vaginal discharge x 2 days, denies itching

## 2012-05-28 NOTE — MAU Provider Note (Signed)
Chart reviewed and agree with management and plan.  

## 2012-05-29 LAB — GC/CHLAMYDIA PROBE AMP: CT Probe RNA: NEGATIVE

## 2012-09-04 ENCOUNTER — Inpatient Hospital Stay (HOSPITAL_COMMUNITY)
Admission: AD | Admit: 2012-09-04 | Discharge: 2012-09-04 | Disposition: A | Discharge status: Home / Self Care | Payer: Medicaid Other | Source: Ambulatory Visit | Attending: Obstetrics & Gynecology | Admitting: Obstetrics & Gynecology

## 2012-09-04 ENCOUNTER — Encounter (HOSPITAL_COMMUNITY): Payer: Self-pay | Admitting: *Deleted

## 2012-09-04 DIAGNOSIS — N898 Other specified noninflammatory disorders of vagina: Secondary | ICD-10-CM

## 2012-09-04 DIAGNOSIS — N899 Noninflammatory disorder of vagina, unspecified: Secondary | ICD-10-CM

## 2012-09-04 DIAGNOSIS — N949 Unspecified condition associated with female genital organs and menstrual cycle: Secondary | ICD-10-CM | POA: Insufficient documentation

## 2012-09-04 DIAGNOSIS — L293 Anogenital pruritus, unspecified: Secondary | ICD-10-CM | POA: Insufficient documentation

## 2012-09-04 HISTORY — DX: Unspecified abnormal cytological findings in specimens from cervix uteri: R87.619

## 2012-09-04 HISTORY — DX: Reserved for concepts with insufficient information to code with codable children: IMO0002

## 2012-09-04 LAB — WET PREP, GENITAL: Clue Cells Wet Prep HPF POC: NONE SEEN

## 2012-09-04 MED ORDER — TERCONAZOLE 0.8 % VA CREA: 1.0000 | TOPICAL_CREAM | Freq: Every day | VAGINAL | Status: DC

## 2012-09-04 NOTE — MAU Note (Signed)
Patient states she has had a slight vaginal discharge with no odor but itching for about 3 days.

## 2012-09-04 NOTE — MAU Provider Note (Signed)
History     CSN: [id number]  Arrival date and time: 09/04/12 1233   None     Chief Complaint  Patient presents with  . Vaginal Discharge   HPI  [name] is 42 y.o. [phone] presents with vaginal itching  X 3 days, without an abnormal discharge.  She called Dr. Viann Shove at [facility] clinic for an appointment but they didn't have an appointment.  She has hx of BV and feels like it may be that again.  LMP 08/23/12.  BTL.  Has one steady partner.    Past Medical History  Diagnosis Date  . Anemia   . Abnormal Pap smear     Past Surgical History  Procedure Laterality Date  . Tonsillectomy    . Finger surgery    . Knee surgery    . Foot surgery    . Cesarean section      Family History  Problem Relation Age of Onset  . Hypertension Mother   . Hypertension Father     History  Substance Use Topics  . Smoking status: Never Smoker   . Smokeless tobacco: Not on file  . Alcohol Use: Yes     Comment: weekends socially    Allergies:  Allergies  Allergen Reactions  . Promethazine Hcl Shortness Of Breath    Prolonged QT on ekg    Prescriptions prior to admission  Medication Sig Dispense Refill  . ibuprofen (ADVIL,MOTRIN) 600 MG tablet Take 600 mg by mouth every 6 (six) hours as needed. For pain        Review of Systems  Constitutional: Negative.   HENT: Negative.   Gastrointestinal: Negative for nausea, vomiting and abdominal pain.  Genitourinary: Negative for dysuria, urgency, frequency and hematuria.       + for vaginal itching white discharge   Physical Exam   Blood pressure 146/60, pulse 58, temperature 98.6 F (37 C), temperature source Oral, resp. rate 16, height 5' 2.5" (1.588 m), weight 172 lb (78.019 kg), last menstrual period 08/23/2012, SpO2 100.00%.  Physical Exam  Constitutional: She is oriented to person, place, and time. She appears well-developed and well-nourished. No distress.  HENT:  Head: Normocephalic.  Neck: Normal range of  motion.  Cardiovascular: Normal rate.   Respiratory: Effort normal.  GI: Soft. She exhibits no distension and no mass. There is no tenderness. There is no rebound and no guarding.  Genitourinary: There is no rash, tenderness or lesion on the right labia. There is no rash, tenderness or lesion on the left labia. Uterus is not enlarged and not tender. Cervix exhibits discharge. Cervix exhibits no motion tenderness and no friability. Right adnexum displays no mass, no tenderness and no fullness. Left adnexum displays no mass, no tenderness and no fullness. No erythema, tenderness or bleeding around the vagina. Vaginal discharge (small amount of white discharge without odor) found.  Neurological: She is alert and oriented to person, place, and time.  Skin: Skin is warm and dry.  Psychiatric: She has a normal mood and affect. Her behavior is normal.   Results for orders placed during the hospital encounter of 09/04/12 (from the past 24 hour(s))  WET PREP, GENITAL     Status: Abnormal   Collection Time    09/04/12  1:09 PM      Result Value Range   Yeast Wet Prep HPF POC NONE SEEN  NONE SEEN   Trich, Wet Prep NONE SEEN  NONE SEEN   Clue Cells Wet Prep HPF POC  NONE SEEN  NONE SEEN   WBC, Wet Prep HPF POC FEW (*) NONE SEEN   MAU Course  Procedures  MDM   Assessment and Plan  A;  Vaginal irritation      Neg wet prep  P:  Rx for Terazol 3 Vaginal creme at HS X 3 days       Follow up with Dr. Daphine Deutscher if sxs do not resolve  Jordani Nunn,EVE M 09/04/2012, 1:08 PM

## 2012-09-05 LAB — GC/CHLAMYDIA PROBE AMP: GC Probe RNA: NEGATIVE

## 2013-06-10 ENCOUNTER — Other Ambulatory Visit: Payer: Self-pay

## 2013-06-10 DIAGNOSIS — Z1231 Encounter for screening mammogram for malignant neoplasm of breast: Secondary | ICD-10-CM

## 2013-06-18 ENCOUNTER — Ambulatory Visit
Admission: RE | Admit: 2013-06-18 | Discharge: 2013-06-18 | Disposition: A | Discharge status: Home / Self Care | Payer: Medicaid Other | Source: Ambulatory Visit

## 2013-06-18 DIAGNOSIS — Z1231 Encounter for screening mammogram for malignant neoplasm of breast: Secondary | ICD-10-CM

## 2013-07-05 ENCOUNTER — Encounter (HOSPITAL_BASED_OUTPATIENT_CLINIC_OR_DEPARTMENT_OTHER): Payer: Self-pay | Admitting: Emergency Medicine

## 2013-07-05 ENCOUNTER — Emergency Department (HOSPITAL_BASED_OUTPATIENT_CLINIC_OR_DEPARTMENT_OTHER): Payer: Medicaid Other

## 2013-07-05 ENCOUNTER — Emergency Department (HOSPITAL_BASED_OUTPATIENT_CLINIC_OR_DEPARTMENT_OTHER)
Admission: EM | Admit: 2013-07-05 | Discharge: 2013-07-05 | Disposition: A | Discharge status: Home / Self Care | Payer: Medicaid Other | Attending: Emergency Medicine | Admitting: Emergency Medicine

## 2013-07-05 DIAGNOSIS — Z79899 Other long term (current) drug therapy: Secondary | ICD-10-CM | POA: Insufficient documentation

## 2013-07-05 DIAGNOSIS — S8002XA Contusion of left knee, initial encounter: Secondary | ICD-10-CM

## 2013-07-05 DIAGNOSIS — S81012A Laceration without foreign body, left knee, initial encounter: Secondary | ICD-10-CM

## 2013-07-05 DIAGNOSIS — S81809A Unspecified open wound, unspecified lower leg, initial encounter: Principal | ICD-10-CM

## 2013-07-05 DIAGNOSIS — Y939 Activity, unspecified: Secondary | ICD-10-CM | POA: Insufficient documentation

## 2013-07-05 DIAGNOSIS — Z23 Encounter for immunization: Secondary | ICD-10-CM | POA: Insufficient documentation

## 2013-07-05 DIAGNOSIS — S8000XA Contusion of unspecified knee, initial encounter: Secondary | ICD-10-CM | POA: Insufficient documentation

## 2013-07-05 DIAGNOSIS — Z862 Personal history of diseases of the blood and blood-forming organs and certain disorders involving the immune mechanism: Secondary | ICD-10-CM | POA: Insufficient documentation

## 2013-07-05 DIAGNOSIS — S91009A Unspecified open wound, unspecified ankle, initial encounter: Principal | ICD-10-CM

## 2013-07-05 DIAGNOSIS — S81009A Unspecified open wound, unspecified knee, initial encounter: Secondary | ICD-10-CM | POA: Insufficient documentation

## 2013-07-05 DIAGNOSIS — Y929 Unspecified place or not applicable: Secondary | ICD-10-CM | POA: Insufficient documentation

## 2013-07-05 DIAGNOSIS — W010XXA Fall on same level from slipping, tripping and stumbling without subsequent striking against object, initial encounter: Secondary | ICD-10-CM | POA: Insufficient documentation

## 2013-07-05 MED ORDER — TETANUS-DIPHTH-ACELL PERTUSSIS 5-2.5-18.5 LF-MCG/0.5 IM SUSP
0.5000 mL | Freq: Once | INTRAMUSCULAR | Status: AC
  Administered 2013-07-05: 0.5 mL via INTRAMUSCULAR
  Filled 2013-07-05: qty 0.5

## 2013-07-05 MED ORDER — HYDROCODONE-ACETAMINOPHEN 5-325 MG PO TABS: 1.0000 | ORAL_TABLET | Freq: Four times a day (QID) | ORAL | Status: DC | PRN

## 2013-07-05 NOTE — Discharge Instructions (Signed)
Wound Care Wound care helps prevent pain and infection.  You may need a tetanus shot if:  You cannot remember when you had your last tetanus shot.  You have never had a tetanus shot.  The injury broke your skin. If you need a tetanus shot and you choose not to have one, you may get tetanus. Sickness from tetanus can be serious. HOME CARE   Only take medicine as told by your doctor.  Clean the wound daily with mild soap and water.  Change any bandages (dressings) as told by your doctor.  Put medicated cream and a bandage on the wound as told by your doctor.  Change the bandage if it gets wet, dirty, or starts to smell.  Take showers. Do not take baths, swim, or do anything that puts your wound under water.  Rest and raise (elevate) the wound until the pain and puffiness (swelling) are better.  Keep all doctor visits as told. GET HELP RIGHT AWAY IF:   Yellowish-white fluid (pus) comes from the wound.  Medicine does not lessen your pain.  There is a red streak going away from the wound.  You have a fever. MAKE SURE YOU:   Understand these instructions.  Will watch your condition.  Will get help right away if you are not doing well or get worse. Document Released: 01/25/2008 Document Revised: 07/10/2011 Document Reviewed: 08/21/2010 [facility] Patient Information 2014 [place], [place]. Knee Pain Knee pain can be a result of an injury or other medical conditions. Treatment will depend on the cause of your pain. HOME CARE  Only take medicine as told by your doctor.  Keep a healthy weight. Being overweight can make the knee hurt more.  Stretch before exercising or playing sports.  If there is constant knee pain, change the way you exercise. Ask your doctor for advice.  Make sure shoes fit well. Choose the right shoe for the sport or activity.  Protect your knees. Wear kneepads if needed.  Rest when you are tired. GET HELP RIGHT AWAY IF:   Your knee pain does not  stop.  Your knee pain does not get better.  Your knee joint feels hot to the touch.  You have a fever. MAKE SURE YOU:   Understand these instructions.  Will watch this condition.  Will get help right away if you are not doing well or get worse. Document Released: 07/14/2008 Document Revised: 07/10/2011 Document Reviewed: 07/14/2008 [facility] Patient Information 2014 [place], [place].

## 2013-07-05 NOTE — ED Notes (Signed)
Pt fell on ice yesterday.  Has laceration to left knee.  Painful with movement.

## 2013-07-05 NOTE — ED Provider Notes (Signed)
CSN: [id number]     Arrival date & time 07/05/13  0957 History   First MD Initiated Contact with Patient 07/05/13 1005     Chief Complaint  Patient presents with  . Fall  . Knee Injury     (Consider location/radiation/quality/duration/timing/severity/associated sxs/prior Treatment) Patient is a 43 y.o. female presenting with fall and knee pain. The history is provided by the patient. No language interpreter was used.  Fall This is a new problem. The current episode started yesterday (about 27 hours ago). The problem occurs rarely. The problem has not changed since onset.Pertinent negatives include no chest pain, no abdominal pain, no headaches and no shortness of breath. The symptoms are aggravated by bending and standing. The symptoms are relieved by rest. Treatments tried: motrin. The treatment provided mild relief.  Knee Pain Location:  Knee Injury: yes   Mechanism of injury: fall   Fall:    Fall occurred:  Tripped   Impact surface:  [employer] of impact:  Knees   Entrapped after fall: no   Knee location:  L knee Pain details:    Quality:  Aching   Radiates to:  Does not radiate   Severity:  Moderate   Onset quality:  Gradual   Duration:  27 hours   Timing:  Constant   Progression:  Improving Chronicity:  New Dislocation: no   Foreign body present:  No foreign bodies Tetanus status:  Out of date Prior injury to area:  No Relieved by:  Immobilization and NSAIDs Worsened by:  Bearing weight, flexion and extension Ineffective treatments:  None tried Associated symptoms: decreased ROM and stiffness   Associated symptoms: no back pain, no fatigue, no fever, no muscle weakness, no neck pain, no numbness, no swelling and no tingling   Risk factors: no concern for non-accidental trauma     Past Medical History  Diagnosis Date  . Anemia   . Abnormal Pap smear    Past Surgical History  Procedure Laterality Date  . Tonsillectomy    . Finger surgery    . Knee surgery      . Foot surgery    . Cesarean section     Family History  Problem Relation Age of Onset  . Hypertension Mother   . Hypertension Father    History  Substance Use Topics  . Smoking status: Never Smoker   . Smokeless tobacco: Not on file  . Alcohol Use: Yes     Comment: weekends socially   OB History   Grav Para Term Preterm Abortions TAB SAB Ect Mult Living   4 4 4       4      Review of Systems  Constitutional: Negative for fever, chills, diaphoresis, activity change, appetite change and fatigue.  HENT: Negative for congestion, facial swelling, rhinorrhea and sore throat.   Eyes: Negative for photophobia and discharge.  Respiratory: Negative for cough, chest tightness and shortness of breath.   Cardiovascular: Negative for chest pain, palpitations and leg swelling.  Gastrointestinal: Negative for nausea, vomiting, abdominal pain and diarrhea.  Endocrine: Negative for polydipsia and polyuria.  Genitourinary: Negative for dysuria, frequency, difficulty urinating and pelvic pain.  Musculoskeletal: Positive for stiffness. Negative for arthralgias, back pain, neck pain and neck stiffness.  Skin: Positive for wound. Negative for color change.  Allergic/Immunologic: Negative for immunocompromised state.  Neurological: Negative for facial asymmetry, weakness, numbness and headaches.  Hematological: Does not bruise/bleed easily.  Psychiatric/Behavioral: Negative for confusion and agitation.  Allergies  Promethazine hcl  Home Medications   Current Outpatient Rx  Name  Route  Sig  Dispense  Refill  . levonorgestrel (MIRENA) 20 MCG/24HR IUD   Intrauterine   1 each by Intrauterine route once.         . cetirizine (ZYRTEC) 10 MG tablet   Oral   Take 10 mg by mouth daily.         Marland Kitchen HYDROcodone-acetaminophen (NORCO) 5-325 MG per tablet   Oral   Take 1 tablet by mouth every 6 (six) hours as needed.   10 tablet   0   . ibuprofen (ADVIL,MOTRIN) 600 MG tablet   Oral    Take 600 mg by mouth every 6 (six) hours as needed. For pain          BP 134/78  Pulse 70  Temp(Src) 98 F (36.7 C) (Oral)  Resp 16  Wt 172 lb (78.019 kg)  SpO2 100% Physical Exam  Constitutional: She is oriented to person, place, and time. She appears well-developed and well-nourished. No distress.  HENT:  Head: Normocephalic and atraumatic.  Mouth/Throat: No oropharyngeal exudate.  Eyes: Pupils are equal, round, and reactive to light.  Neck: Normal range of motion. Neck supple.  Cardiovascular: Normal rate, regular rhythm and normal heart sounds.  Exam reveals no gallop and no friction rub.   No murmur heard. Pulmonary/Chest: Effort normal and breath sounds normal. No respiratory distress. She has no wheezes. She has no rales.  Abdominal: Soft. Bowel sounds are normal. She exhibits no distension and no mass. There is no tenderness. There is no rebound and no guarding.  Musculoskeletal: Normal range of motion. She exhibits no edema and no tenderness.       Right knee: She exhibits laceration and bony tenderness.       Legs: superficial laceration over L patella  Neurological: She is alert and oriented to person, place, and time.  Skin: Skin is warm and dry.  Psychiatric: She has a normal mood and affect.    ED Course  Procedures (including critical care time) Labs Review Labs Reviewed - No data to display Imaging Review Dg Knee Complete 4 Views Left  07/05/2013   CLINICAL DATA:  Fall, left knee pain.  Anterior laceration.  EXAM: LEFT KNEE - COMPLETE 4+ VIEW  COMPARISON:  None.  FINDINGS: Mild anterior soft tissue swelling. No radiopaque foreign bodies within the soft tissues. No underlying fracture, subluxation or dislocation. No joint effusion.  IMPRESSION: No acute bony abnormality or radiopaque foreign body.   Electronically Signed   By: Rolm Baptise M.D.   On: 07/05/2013 10:57     EKG Interpretation None      MDM   Final diagnoses:  Contusion of left knee    Laceration of left knee    Pt is a 43 y.o. female with Pmhx as above who presents with L knee pain and laceration after fall onto ice about 27 hrs ago.  She has superfical laceration over knee.  Given she is greater than 24 hrs out from injury and it is not deep, will allow to heal by secondary intention.  Tdap updated. XR knee negative for acute fracture.  Pt placed in knee immobilizer to protect lac for short term.  Return precautions given for new or worsening symptoms including s/sx of infection, worsening pain. She will f/u with PCP for reapear knee exam in about 1 week.          Neta Ehlers, MD  07/05/13 2209 

## 2014-03-02 ENCOUNTER — Encounter (HOSPITAL_BASED_OUTPATIENT_CLINIC_OR_DEPARTMENT_OTHER): Payer: Self-pay | Admitting: Emergency Medicine

## 2014-06-22 ENCOUNTER — Other Ambulatory Visit: Payer: Self-pay

## 2014-06-22 DIAGNOSIS — Z1231 Encounter for screening mammogram for malignant neoplasm of breast: Secondary | ICD-10-CM

## 2014-06-26 ENCOUNTER — Ambulatory Visit: Payer: Medicaid Other

## 2014-07-23 ENCOUNTER — Ambulatory Visit: Payer: Medicaid Other

## 2014-07-23 ENCOUNTER — Ambulatory Visit
Admission: RE | Admit: 2014-07-23 | Discharge: 2014-07-23 | Disposition: A | Discharge status: Home / Self Care | Payer: Commercial Managed Care - PPO | Source: Ambulatory Visit

## 2014-07-23 DIAGNOSIS — Z1231 Encounter for screening mammogram for malignant neoplasm of breast: Secondary | ICD-10-CM

## 2016-02-16 ENCOUNTER — Other Ambulatory Visit: Payer: Self-pay | Admitting: Family Medicine

## 2016-02-16 ENCOUNTER — Ambulatory Visit
Admission: RE | Admit: 2016-02-16 | Discharge: 2016-02-16 | Disposition: A | Discharge status: Home / Self Care | Payer: Commercial Managed Care - PPO | Source: Ambulatory Visit | Attending: Family Medicine | Admitting: Family Medicine

## 2016-02-16 DIAGNOSIS — R590 Localized enlarged lymph nodes: Secondary | ICD-10-CM

## 2016-08-16 ENCOUNTER — Inpatient Hospital Stay (HOSPITAL_COMMUNITY)
Admission: AD | Admit: 2016-08-16 | Discharge: 2016-08-16 | Disposition: A | Discharge status: Home / Self Care | Payer: BLUE CROSS/BLUE SHIELD | Source: Ambulatory Visit | Attending: Obstetrics & Gynecology | Admitting: Obstetrics & Gynecology

## 2016-08-16 DIAGNOSIS — B9689 Other specified bacterial agents as the cause of diseases classified elsewhere: Secondary | ICD-10-CM

## 2016-08-16 DIAGNOSIS — N898 Other specified noninflammatory disorders of vagina: Secondary | ICD-10-CM | POA: Diagnosis present

## 2016-08-16 DIAGNOSIS — N760 Acute vaginitis: Secondary | ICD-10-CM

## 2016-08-16 DIAGNOSIS — B373 Candidiasis of vulva and vagina: Secondary | ICD-10-CM | POA: Diagnosis not present

## 2016-08-16 DIAGNOSIS — B3731 Acute candidiasis of vulva and vagina: Secondary | ICD-10-CM

## 2016-08-16 LAB — URINALYSIS, ROUTINE W REFLEX MICROSCOPIC
BACTERIA UA: NONE SEEN
Bilirubin Urine: NEGATIVE
Glucose, UA: NEGATIVE mg/dL
Hgb urine dipstick: NEGATIVE
Ketones, ur: NEGATIVE mg/dL
NITRITE: NEGATIVE
Protein, ur: NEGATIVE mg/dL
Specific Gravity, Urine: 1.021 (ref 1.005–1.030)
pH: 6 (ref 5.0–8.0)

## 2016-08-16 LAB — WET PREP, GENITAL
Sperm: NONE SEEN
Trich, Wet Prep: NONE SEEN

## 2016-08-16 LAB — POCT PREGNANCY, URINE: PREG TEST UR: NEGATIVE

## 2016-08-16 MED ORDER — METRONIDAZOLE 500 MG PO TABS
500.0000 mg | ORAL_TABLET | Freq: Two times a day (BID) | ORAL | 0 refills | Status: DC
Start: 2016-08-16 — End: 2017-07-19

## 2016-08-16 MED ORDER — FLUCONAZOLE 150 MG PO TABS: 150.0000 mg | ORAL_TABLET | Freq: Once | ORAL | 0 refills | Status: AC

## 2016-08-16 NOTE — MAU Provider Note (Signed)
History     CSN: [id number]  Arrival date and time: 08/16/16 1622   None     Chief Complaint  Patient presents with  . Vaginal Discharge   Non-pregnant female here with vaginal discharge and itching. Sx started 2 days ago. Vaginal discharge is thin, white, and malodorous. Itching feels internal. No new partner but reports recent encounter in which she did not use a condom. No other c/o.   Pertinent Gynecological History: Menses: none Contraception: IUD Sexually transmitted diseases: no past history  Past Medical History:  Diagnosis Date  . Abnormal Pap smear   . Anemia     Past Surgical History:  Procedure Laterality Date  . CESAREAN SECTION    . FINGER SURGERY    . FOOT SURGERY    . KNEE SURGERY    . TONSILLECTOMY      Family History  Problem Relation Age of Onset  . Hypertension Mother   . Hypertension Father     Social History  Substance Use Topics  . Smoking status: Never Smoker  . Smokeless tobacco: Not on file  . Alcohol use Yes     Comment: weekends socially    Allergies:  Allergies  Allergen Reactions  . Promethazine Hcl Shortness Of Breath    Prolonged QT on ekg    Prescriptions Prior to Admission  Medication Sig Dispense Refill Last Dose  . cetirizine (ZYRTEC) 10 MG tablet Take 10 mg by mouth daily.   09/04/2012 at Unknown  . HYDROcodone-acetaminophen (NORCO) 5-325 MG per tablet Take 1 tablet by mouth every 6 (six) hours as needed. 10 tablet 0   . ibuprofen (ADVIL,MOTRIN) 600 MG tablet Take 600 mg by mouth every 6 (six) hours as needed. For pain   09/03/2012 at Unknown  . levonorgestrel (MIRENA) 20 MCG/24HR IUD 1 each by Intrauterine route once.       Review of Systems  Constitutional: Negative.   Gastrointestinal: Negative.   Genitourinary: Positive for vaginal discharge and vaginal pain (itching).   Physical Exam   Blood pressure 136/74, pulse 75, temperature 98.4 F (36.9 C), temperature source Oral, resp. rate 18, height 5\' 2"  (1.575  m), weight 83.9 kg (185 lb).  Physical Exam  Nursing note and vitals reviewed. Constitutional: She is oriented to person, place, and time. She appears well-developed and well-nourished. No distress.  HENT:  Head: Normocephalic and atraumatic.  Neck: Normal range of motion.  Cardiovascular: Normal rate.   Respiratory: Effort normal.  Genitourinary: Vagina normal.  Genitourinary Comments: External: no lesions, erythema and superficial fissure at introitus, no discharge seen   Musculoskeletal: Normal range of motion.  Neurological: She is alert and oriented to person, place, and time.  Skin: Skin is warm and dry.  Psychiatric: She has a normal mood and affect.   Results for orders placed or performed during the hospital encounter of 08/16/16 (from the past 24 hour(s))  Urinalysis, Routine w reflex microscopic     Status: Abnormal   Collection Time: 08/16/16  4:53 PM  Result Value Ref Range   Color, Urine YELLOW YELLOW   APPearance CLEAR CLEAR   Specific Gravity, Urine 1.021 1.005 - 1.030   pH 6.0 5.0 - 8.0   Glucose, UA NEGATIVE NEGATIVE mg/dL   Hgb urine dipstick NEGATIVE NEGATIVE   Bilirubin Urine NEGATIVE NEGATIVE   Ketones, ur NEGATIVE NEGATIVE mg/dL   Protein, ur NEGATIVE NEGATIVE mg/dL   Nitrite NEGATIVE NEGATIVE   Leukocytes, UA SMALL (A) NEGATIVE   RBC / HPF 0-5  0 - 5 RBC/hpf   WBC, UA 0-5 0 - 5 WBC/hpf   Bacteria, UA NONE SEEN NONE SEEN   Squamous Epithelial / LPF 0-5 (A) NONE SEEN   Mucous PRESENT   Pregnancy, urine POC     Status: None   Collection Time: 08/16/16  5:01 PM  Result Value Ref Range   Preg Test, Ur NEGATIVE NEGATIVE  Wet prep, genital     Status: Abnormal   Collection Time: 08/16/16  5:55 PM  Result Value Ref Range   Yeast Wet Prep HPF POC PRESENT (A) NONE SEEN   Trich, Wet Prep NONE SEEN NONE SEEN   Clue Cells Wet Prep HPF POC PRESENT (A) NONE SEEN   WBC, Wet Prep HPF POC MODERATE (A) NONE SEEN   Sperm NONE SEEN    MAU Course   Procedures  MDM Labs ordered and reviewed. Will treat yeast and BV. Stable for discharge home.  Assessment and Plan   1. Yeast vaginitis   2. Bacterial vaginosis    Discharge home Follow up with PCP as needed Rx Flagyl Rx Diflucan Abstain from IC until sx resolved  Allergies as of 08/16/2016      Reactions   Promethazine Hcl Shortness Of Breath   Prolonged QT on ekg      Medication List    STOP taking these medications   HYDROcodone-acetaminophen 5-325 MG tablet Commonly known as:  NORCO     TAKE these medications   cetirizine 10 MG tablet Commonly known as:  ZYRTEC Take 10 mg by mouth daily.   fluconazole 150 MG tablet Commonly known as:  DIFLUCAN Take 1 tablet (150 mg total) by mouth once.   ibuprofen 600 MG tablet Commonly known as:  ADVIL,MOTRIN Take 600 mg by mouth every 6 (six) hours as needed. For pain   levonorgestrel 20 MCG/24HR IUD Commonly known as:  MIRENA 1 each by Intrauterine route once.   metroNIDAZOLE 500 MG tablet Commonly known as:  FLAGYL Take 1 tablet (500 mg total) by mouth 2 (two) times daily.      Julianne Handler, CNM 08/16/2016, 5:57 PM

## 2016-08-16 NOTE — Discharge Instructions (Signed)
Bacterial Vaginosis Bacterial vaginosis is an infection of the vagina. It happens when too many germs (bacteria) grow in the vagina. This infection puts you at risk for infections from sex (STIs). Treating this infection can lower your risk for some STIs. You should also treat this if you are pregnant. It can cause your baby to be born early. Follow these instructions at home: Medicines   Take over-the-counter and prescription medicines only as told by your doctor.  Take or use your antibiotic medicine as told by your doctor. Do not stop taking or using it even if you start to feel better. General instructions   If you your sexual partner is a woman, tell her that you have this infection. She needs to get treatment if she has symptoms. If you have a female partner, he does not need to be treated.  During treatment:  Avoid sex.  Do not douche.  Avoid alcohol as told.  Avoid breastfeeding as told.  Drink enough fluid to keep your pee (urine) clear or pale yellow.  Keep your vagina and butt (rectum) clean.  Wash the area with warm water every day.  Wipe from front to back after you use the toilet.  Keep all follow-up visits as told by your doctor. This is important. Preventing this condition   Do not douche.  Use only warm water to wash around your vagina.  Use protection when you have sex. This includes:  Latex condoms.  Dental dams.  Limit how many people you have sex with. It is best to only have sex with the same person (be monogamous).  Get tested for STIs. Have your partner get tested.  Wear underwear that is cotton or lined with cotton.  Avoid tight pants and pantyhose. This is most important in summer.  Do not use any products that have nicotine or tobacco in them. These include cigarettes and e-cigarettes. If you need help quitting, ask your doctor.  Do not use illegal drugs.  Limit how much alcohol you drink. Contact a doctor if:  Your symptoms do not  get better, even after you are treated.  You have more discharge or pain when you pee (urinate).  You have a fever.  You have pain in your belly (abdomen).  You have pain with sex.  Your bleed from your vagina between periods. Summary  This infection happens when too many germs (bacteria) grow in the vagina.  Treating this condition can lower your risk for some infections from sex (STIs).  You should also treat this if you are pregnant. It can cause early (premature) birth.  Do not stop taking or using your antibiotic medicine even if you start to feel better. This information is not intended to replace advice given to you by your health care provider. Make sure you discuss any questions you have with your health care provider. Document Released: 01/25/2008 Document Revised: 01/01/2016 Document Reviewed: 01/01/2016 Elsevier Interactive Patient Education  2017 [employer]  Vaginal Yeast infection, Adult Vaginal yeast infection is a condition that causes soreness, swelling, and redness (inflammation) of the vagina. It also causes vaginal discharge. This is a common condition. Some women get this infection frequently. What are the causes? This condition is caused by a change in the normal balance of the yeast (candida) and bacteria that live in the vagina. This change causes an overgrowth of yeast, which causes the inflammation. What increases the risk? This condition is more likely to develop in:  Women who take antibiotic medicines.  Women who have diabetes.  Women who take birth control pills.  Women who are pregnant.  Women who douche often.  Women who have a weak defense (immune) system.  Women who have been taking steroid medicines for a long time.  Women who frequently wear tight clothing. What are the signs or symptoms? Symptoms of this condition include:  White, thick vaginal discharge.  Swelling, itching, redness, and irritation of the vagina. The lips of  the vagina (vulva) may be affected as well.  Pain or a burning feeling while urinating.  Pain during sex. How is this diagnosed? This condition is diagnosed with a medical history and physical exam. This will include a pelvic exam. Your health care provider will examine a sample of your vaginal discharge under a microscope. Your health care provider may send this sample for testing to confirm the diagnosis. How is this treated? This condition is treated with medicine. Medicines may be over-the-counter or prescription. You may be told to use one or more of the following:  Medicine that is taken orally.  Medicine that is applied as a cream.  Medicine that is inserted directly into the vagina (suppository). Follow these instructions at home:  Take or apply over-the-counter and prescription medicines only as told by your health care provider.  Do not have sex until your health care provider has approved. Tell your sex partner that you have a yeast infection. That person should go to his or her health care provider if he or she develops symptoms.  Do not wear tight clothes, such as pantyhose or tight pants.  Avoid using tampons until your health care provider approves.  Eat more yogurt. This may help to keep your yeast infection from returning.  Try taking a sitz bath to help with discomfort. This is a warm water bath that is taken while you are sitting down. The water should only come up to your hips and should cover your buttocks. Do this 3-4 times per day or as told by your health care provider.  Do not douche.  Wear breathable, cotton underwear.  If you have diabetes, keep your blood sugar levels under control. Contact a health care provider if:  You have a fever.  Your symptoms go away and then return.  Your symptoms do not get better with treatment.  Your symptoms get worse.  You have new symptoms.  You develop blisters in or around your vagina.  You have blood coming  from your vagina and it is not your menstrual period.  You develop pain in your abdomen. This information is not intended to replace advice given to you by your health care provider. Make sure you discuss any questions you have with your health care provider. Document Released: 01/25/2005 Document Revised: 09/29/2015 Document Reviewed: 10/19/2014 Elsevier Interactive Patient Education  2017 [employer].

## 2016-08-16 NOTE — MAU Note (Signed)
Pt C/O vaginal discharge, white thin, has odor.  Has itching & irritation.  Denies pelvic pain or bleeding.

## 2016-08-17 LAB — GC/CHLAMYDIA PROBE AMP (~~LOC~~) NOT AT ARMC
Chlamydia: NEGATIVE
Neisseria Gonorrhea: NEGATIVE

## 2017-02-02 ENCOUNTER — Encounter (HOSPITAL_COMMUNITY): Payer: Self-pay | Admitting: Emergency Medicine

## 2017-02-02 ENCOUNTER — Ambulatory Visit (HOSPITAL_COMMUNITY)
Admission: EM | Admit: 2017-02-02 | Discharge: 2017-02-02 | Disposition: A | Discharge status: Home / Self Care | Payer: BLUE CROSS/BLUE SHIELD | Attending: Physician Assistant | Admitting: Physician Assistant

## 2017-02-02 DIAGNOSIS — M542 Cervicalgia: Secondary | ICD-10-CM | POA: Insufficient documentation

## 2017-02-02 DIAGNOSIS — N898 Other specified noninflammatory disorders of vagina: Secondary | ICD-10-CM | POA: Insufficient documentation

## 2017-02-02 DIAGNOSIS — Z8619 Personal history of other infectious and parasitic diseases: Secondary | ICD-10-CM | POA: Insufficient documentation

## 2017-02-02 DIAGNOSIS — T148XXA Other injury of unspecified body region, initial encounter: Secondary | ICD-10-CM | POA: Diagnosis not present

## 2017-02-02 MED ORDER — FLUCONAZOLE 150 MG PO TABS: 150.0000 mg | ORAL_TABLET | Freq: Every day | ORAL | 0 refills | Status: DC

## 2017-02-02 MED ORDER — CYCLOBENZAPRINE HCL 10 MG PO TABS: 10.0000 mg | ORAL_TABLET | Freq: Every evening | ORAL | 0 refills | Status: DC | PRN

## 2017-02-02 MED ORDER — NAPROXEN 500 MG PO TABS: 500.0000 mg | ORAL_TABLET | Freq: Two times a day (BID) | ORAL | 0 refills | Status: AC

## 2017-02-02 NOTE — Discharge Instructions (Signed)
You were treated empirically for yeast. Start diflucan as directed. Cytology sent, you will be contacted with any positive results that requires further treatment. Refrain from sexual activity for the next 7 days. Monitor for any worsening of symptoms, fever, abdominal pain, nausea, vomiting, to follow up for reevaluation.  Your exam was most consistent with muscle strain. Start Naproxen as directed. Do not take ibuprofen while on naproxen. Flexeril as needed at night, do not take ambien while on flexeril. Flexeril can make you drowsy, so do not take if you are going to drive, operate heavy machinery, or make important decisions. Ice/heat compresses as needed. This can take up to 3-4 weeks to completely resolve, but you should be feeling better each week. Follow up here or with PCP if symptoms worsen, changes for reevaluation.

## 2017-02-02 NOTE — ED Triage Notes (Signed)
Pt here for vag d/c and itching onset 2 weeks  Not concerned for STDs  Also c/o neck and upper back pain onset 3 days  Taking ibup w/no relief.   A&O x4... NAD... Ambulatory

## 2017-02-02 NOTE — ED Provider Notes (Signed)
[place]    CSN: [id number] Arrival date & time: 02/02/17  1108     History   Chief Complaint Chief Complaint  Patient presents with  . Vaginal Discharge  . Neck Pain    HPI [name] is a 46 y.o. female.   46 year old female comes in for multiple complaints.  1. 2 week history of vaginal discharge and itchiness. Patient states symptoms started after using bath and body work bath salts. Had contacted PCP but never got a return call. Patient states she looked online and used yogurt in her vagina. Given using yogurt, she has been using panty liners, which causes more irritation. Not sure about vaginal discharge has she has been using the yogurt. Has not used since 2 days ago. Denise abdominal pain, nausea, vomiting. Denies urinary symptoms such as frequency, dysuria, hematuria. Not currently sexually active, last sexually active 2 months ago. Has an IUD and does not get cycles.   2. 3 day history of neck and upper back pain. Sharp pain shooting up the neck.  Feels tightness in the back. Ibuprofen 600-800mg  5-8hrs a day for 2 days with minimal relief. Patient is a CNA, does a lot of lifting. Restarted position 3 weeks ago. Denies injury/trauma, numbness/tingling, loss of bladder/bowel control.       Past Medical History:  Diagnosis Date  . Abnormal Pap smear   . Anemia     Patient Active Problem List   Diagnosis Date Noted  . Bacterial vaginosis 05/12/2011    Past Surgical History:  Procedure Laterality Date  . CESAREAN SECTION    . FINGER SURGERY    . FOOT SURGERY    . KNEE SURGERY    . TONSILLECTOMY      OB History    Gravida Para Term Preterm AB Living   4 4 4     4    SAB TAB Ectopic Multiple Live Births                   Home Medications    Prior to Admission medications   Medication Sig Start Date End Date Taking? Authorizing Provider  zolpidem (AMBIEN) 5 MG tablet Take 5 mg by mouth at bedtime as needed for sleep.   Yes  [provider]  cetirizine (ZYRTEC) 10 MG tablet Take 10 mg by mouth daily.    [provider]  cyclobenzaprine (FLEXERIL) 10 MG tablet Take 1 tablet (10 mg total) by mouth at bedtime as needed for muscle spasms. 02/02/17   Tasia Catchings, Amy V, PA-C  fluconazole (DIFLUCAN) 150 MG tablet Take 1 tablet (150 mg total) by mouth daily. Take second dose 72 hours later if symptoms still persists. 02/02/17   Tasia Catchings, Amy V, PA-C  ibuprofen (ADVIL,MOTRIN) 600 MG tablet Take 600 mg by mouth every 6 (six) hours as needed. For pain    [provider]  levonorgestrel (MIRENA) 20 MCG/24HR IUD 1 each by Intrauterine route once.    [provider]  metroNIDAZOLE (FLAGYL) 500 MG tablet Take 1 tablet (500 mg total) by mouth 2 (two) times daily. 08/16/16   Julianne Handler, CNM  naproxen (NAPROSYN) 500 MG tablet Take 1 tablet (500 mg total) by mouth 2 (two) times daily. 02/02/17 02/12/17  Ok Edwards, PA-C    Family History Family History  Problem Relation Age of Onset  . Hypertension Mother   . Hypertension Father     Social History Social History  Substance Use Topics  .  Smoking status: Never Smoker  . Smokeless tobacco: Never Used  . Alcohol use Yes     Comment: weekends socially     Allergies   Promethazine hcl   Review of Systems Review of Systems  Reason unable to perform ROS: See HPI as above.     Physical Exam Triage Vital Signs ED Triage Vitals  Enc Vitals Group     BP 02/02/17 1113 (!) 147/58     Pulse Rate 02/02/17 1113 (!) 55     Resp 02/02/17 1113 20     Temp 02/02/17 1113 97.9 F (36.6 C)     Temp Source 02/02/17 1113 Oral     SpO2 02/02/17 1113 100 %     Weight --      Height --      Head Circumference --      Peak Flow --      Pain Score 02/02/17 1114 7     Pain Loc --      Pain Edu? --      Excl. in [place]? --    No data found.   Updated Vital Signs BP (!) 147/58 (BP Location: Left Arm)   Pulse (!) 55   Temp 97.9 F (36.6 C) (Oral)   Resp 20    SpO2 100%   Physical Exam  Constitutional: She is oriented to person, place, and time. She appears well-developed and well-nourished. No distress.  HENT:  Head: Normocephalic and atraumatic.  Eyes: Pupils are equal, round, and reactive to light. Conjunctivae are normal.  Neck: Normal range of motion. Neck supple. No spinous process tenderness and no muscular tenderness present. Normal range of motion present.  Cardiovascular: Normal rate, regular rhythm and normal heart sounds.  Exam reveals no gallop and no friction rub.   No murmur heard. Pulmonary/Chest: Effort normal and breath sounds normal. She has no wheezes. She has no rales.  Genitourinary:  Genitourinary Comments: Deferred. Patient self swabbed for cytology.   Musculoskeletal:  No tenderness on palpation of the spinous processes. Tenderness to palpation of right upper back, specifically around the scapula. No tenderness on palpation of shoulders. Full range of motion of back and shoulder. Strength normal and equal bilaterally. Sensation intact and equal bilaterally.  Neurological: She is alert and oriented to person, place, and time.  Skin: Skin is warm and dry.     UC Treatments / Results  Labs (all labs ordered are listed, but only abnormal results are displayed) Labs Reviewed  CERVICOVAGINAL ANCILLARY ONLY    EKG  EKG Interpretation None       Radiology No results found.  Procedures Procedures (including critical care time)  Medications Ordered in UC Medications - No data to display   Initial Impression / Assessment and Plan / UC Course  I have reviewed the triage vital signs and the nursing notes.  Pertinent labs & imaging results that were available during my care of the patient were reviewed by me and considered in my medical decision making (see chart for details).      Patient was treated empirically for yeast. Start diflucan. Cytology sent, patient will be contacted with any positive results  that require additional treatment. Patient to refrain from sexual activity for the next 7 days. Return precautions given.  Discussed with patient history and exam most consistent with muscle strain. Start NSAID as directed for pain and inflammation. Muscle relaxant as needed. Patient to avoid ambien use while on muscle relaxant. Ice/heat compresses. Discussed with patient strain  can take up to 3-4 weeks to resolve, but should be getting better each week. Return precautions given.    Final Clinical Impressions(s) / UC Diagnoses   Final diagnoses:  Vaginal irritation  Muscle strain    New Prescriptions New Prescriptions   CYCLOBENZAPRINE (FLEXERIL) 10 MG TABLET    Take 1 tablet (10 mg total) by mouth at bedtime as needed for muscle spasms.   FLUCONAZOLE (DIFLUCAN) 150 MG TABLET    Take 1 tablet (150 mg total) by mouth daily. Take second dose 72 hours later if symptoms still persists.   NAPROXEN (NAPROSYN) 500 MG TABLET    Take 1 tablet (500 mg total) by mouth 2 (two) times daily.      Ok Edwards, PA-C 02/02/17 1255

## 2017-02-05 LAB — CERVICOVAGINAL ANCILLARY ONLY
BACTERIAL VAGINITIS: POSITIVE — AB
Candida vaginitis: POSITIVE — AB
Chlamydia: NEGATIVE
NEISSERIA GONORRHEA: NEGATIVE
TRICH (WINDOWPATH): NEGATIVE

## 2017-02-09 ENCOUNTER — Telehealth (HOSPITAL_COMMUNITY): Payer: Self-pay | Admitting: Emergency Medicine

## 2017-02-09 MED ORDER — METRONIDAZOLE 500 MG PO TABS: 500.0000 mg | ORAL_TABLET | Freq: Two times a day (BID) | ORAL | 0 refills | Status: DC

## 2017-02-09 NOTE — Telephone Encounter (Signed)
Pt called back... Results given Wants [place] to call in Flagyl to [facility]) Rx sent to pharm of choice.

## 2017-02-09 NOTE — Telephone Encounter (Signed)
-----   Message from Sherlene Shams, MD sent at 02/05/2017  8:19 PM EDT ----- Clinical staff, please let patient know that test for candida (yeast) was positive.  Rx fluconazole was given at the urgent care visit 10/5.   Test for gardnerella (bacterial vaginosis) was positive.  This only needs to be treated if there are persistent symptoms, such as vaginal irritation/discharge.   If these symptoms are present, ok to send rx for metronidazole 500mg  bid x 7d #14 no refills or metronidazole vaginal gel 5.69% 1 applicatorful bid x 7d #79 no refills.   Recheck or followup with primary care provider for further evaluation as needed.  LM

## 2017-07-19 ENCOUNTER — Encounter (HOSPITAL_COMMUNITY): Payer: Self-pay | Admitting: Family Medicine

## 2017-07-19 ENCOUNTER — Ambulatory Visit (HOSPITAL_COMMUNITY)
Admission: EM | Admit: 2017-07-19 | Discharge: 2017-07-19 | Disposition: A | Discharge status: Home / Self Care | Payer: BLUE CROSS/BLUE SHIELD | Attending: Family Medicine | Admitting: Family Medicine

## 2017-07-19 DIAGNOSIS — Z79899 Other long term (current) drug therapy: Secondary | ICD-10-CM | POA: Insufficient documentation

## 2017-07-19 DIAGNOSIS — N898 Other specified noninflammatory disorders of vagina: Secondary | ICD-10-CM | POA: Diagnosis present

## 2017-07-19 DIAGNOSIS — N760 Acute vaginitis: Secondary | ICD-10-CM | POA: Diagnosis not present

## 2017-07-19 MED ORDER — METRONIDAZOLE 500 MG PO TABS: 500.0000 mg | ORAL_TABLET | Freq: Two times a day (BID) | ORAL | 0 refills | Status: AC

## 2017-07-19 MED ORDER — FLUCONAZOLE 200 MG PO TABS: 200.0000 mg | ORAL_TABLET | Freq: Once | ORAL | 0 refills | Status: AC

## 2017-07-19 NOTE — ED Triage Notes (Signed)
Pt here for possible yeast infection

## 2017-07-19 NOTE — ED Provider Notes (Signed)
[place]    CSN: [id number] Arrival date & time: 07/19/17  1959     History   Chief Complaint Chief Complaint  Patient presents with  . Vaginitis    HPI [name] is a 47 y.o. female.   [name] presents with complaints of vaginal discharge with odor and mild itching which started approximately 4 days ago. History of bv and yeast infections. Did have sex with new partner last week, states some condoms even cause irritation for her. No specific known std exposure. Without pain or bleeding. No known fevers. Has not taken any medications for her symptoms. States she has been borderline hypertensive in the past. Denies headache, chest pain, shortness of breath .    ROS per HPI.      Past Medical History:  Diagnosis Date  . Abnormal Pap smear   . Anemia     Patient Active Problem List   Diagnosis Date Noted  . Bacterial vaginosis 05/12/2011    Past Surgical History:  Procedure Laterality Date  . CESAREAN SECTION    . FINGER SURGERY    . FOOT SURGERY    . KNEE SURGERY    . TONSILLECTOMY      OB History    Gravida  4   Para  4   Term  4   Preterm      AB      Living  4     SAB      TAB      Ectopic      Multiple      Live Births               Home Medications    Prior to Admission medications   Medication Sig Start Date End Date Taking? Authorizing Provider  cetirizine (ZYRTEC) 10 MG tablet Take 10 mg by mouth daily.    [provider]  cyclobenzaprine (FLEXERIL) 10 MG tablet Take 1 tablet (10 mg total) by mouth at bedtime as needed for muscle spasms. 02/02/17   Tasia Catchings, Amy V, PA-C  fluconazole (DIFLUCAN) 200 MG tablet Take 1 tablet (200 mg total) by mouth once for 1 dose. 07/19/17 07/19/17  Augusto Gamble B, NP  ibuprofen (ADVIL,MOTRIN) 600 MG tablet Take 600 mg by mouth every 6 (six) hours as needed. For pain    [provider]  levonorgestrel (MIRENA) 20 MCG/24HR IUD 1 each by Intrauterine route once.     [provider]  metroNIDAZOLE (FLAGYL) 500 MG tablet Take 1 tablet (500 mg total) by mouth 2 (two) times daily for 7 days. 07/19/17 07/26/17  Zigmund Gottron, NP  zolpidem (AMBIEN) 5 MG tablet Take 5 mg by mouth at bedtime as needed for sleep.    [provider]    Family History Family History  Problem Relation Age of Onset  . Hypertension Mother   . Hypertension Father     Social History Social History   Tobacco Use  . Smoking status: Never Smoker  . Smokeless tobacco: Never Used  Substance Use Topics  . Alcohol use: Yes    Comment: weekends socially  . Drug use: No     Allergies   Promethazine hcl   Review of Systems Review of Systems   Physical Exam Triage Vital Signs ED Triage Vitals  Enc Vitals Group     BP 07/19/17 2101 (!) 193/98     Pulse Rate 07/19/17 2101 69     Resp 07/19/17 2101 18  Temp --      Temp src --      SpO2 07/19/17 2101 100 %     Weight --      Height --      Head Circumference --      Peak Flow --      Pain Score 07/19/17 2057 0     Pain Loc --      Pain Edu? --      Excl. in [place]? --    No data found.  Updated Vital Signs BP (!) 193/98   Pulse 69   Resp 18   SpO2 100%   Visual Acuity Right Eye Distance:   Left Eye Distance:   Bilateral Distance:    Right Eye Near:   Left Eye Near:    Bilateral Near:     Physical Exam  Constitutional: She is oriented to person, place, and time. She appears well-developed and well-nourished. No distress.  Cardiovascular: Normal rate, regular rhythm and normal heart sounds.  Pulmonary/Chest: Effort normal and breath sounds normal.  Abdominal: Soft. There is no tenderness.  Genitourinary:  Genitourinary Comments: Without pain, lesions or bleeding per patient, pelvic exam deferred   Neurological: She is alert and oriented to person, place, and time.  Skin: Skin is warm and dry.     UC Treatments / Results  Labs (all labs ordered are listed, but only abnormal  results are displayed) Labs Reviewed  URINE CYTOLOGY ANCILLARY ONLY    EKG  EKG Interpretation None       Radiology No results found.  Procedures Procedures (including critical care time)  Medications Ordered in UC Medications - No data to display   Initial Impression / Assessment and Plan / UC Course  I have reviewed the triage vital signs and the nursing notes.  Pertinent labs & imaging results that were available during my care of the patient were reviewed by me and considered in my medical decision making (see chart for details).     Diflucan and flagyl provided. Urine cytology pending. Will notify of any positive findings and if any changes to treatment are needed.  Patient verbalized understanding and agreeable to plan.  Follow up with PCP for BP recheck.   Final Clinical Impressions(s) / UC Diagnoses   Final diagnoses:  Acute vaginitis    ED Discharge Orders        Ordered    fluconazole (DIFLUCAN) 200 MG tablet   Once     07/19/17 2103    metroNIDAZOLE (FLAGYL) 500 MG tablet  2 times daily     07/19/17 2103       Controlled Substance Prescriptions [place] Controlled Substance Registry consulted? Not Applicable   Zigmund Gottron, NP 07/19/17 2107

## 2017-07-19 NOTE — Discharge Instructions (Signed)
Will notify you of any positive findings and if any changes to treatment are needed.   Do not drink alcohol while on treatment. Please withhold from intercourse for the next week. Please use condoms to prevent STD's.

## 2017-07-20 LAB — URINE CYTOLOGY ANCILLARY ONLY
Chlamydia: NEGATIVE
Neisseria Gonorrhea: NEGATIVE
Trichomonas: NEGATIVE

## 2017-07-24 LAB — URINE CYTOLOGY ANCILLARY ONLY: Candida vaginitis: NEGATIVE

## 2017-08-02 ENCOUNTER — Encounter (HOSPITAL_COMMUNITY): Payer: Self-pay | Admitting: Emergency Medicine

## 2017-08-02 ENCOUNTER — Ambulatory Visit (HOSPITAL_COMMUNITY)
Admission: EM | Admit: 2017-08-02 | Discharge: 2017-08-02 | Disposition: A | Discharge status: Home / Self Care | Payer: BLUE CROSS/BLUE SHIELD | Attending: Family Medicine | Admitting: Family Medicine

## 2017-08-02 DIAGNOSIS — N898 Other specified noninflammatory disorders of vagina: Secondary | ICD-10-CM

## 2017-08-02 DIAGNOSIS — N760 Acute vaginitis: Secondary | ICD-10-CM | POA: Insufficient documentation

## 2017-08-02 MED ORDER — CLINDAMYCIN HCL 150 MG PO CAPS: 300.0000 mg | ORAL_CAPSULE | Freq: Two times a day (BID) | ORAL | 0 refills | Status: AC

## 2017-08-02 NOTE — ED Triage Notes (Signed)
Pt states "I think i've still got BV, I took my medicine but I went on a trip and I forgot my medicine and I only took part of it, I came back and then continued taking it". Pt c/o vaginal odor.

## 2017-08-02 NOTE — ED Provider Notes (Signed)
[place]    CSN: [id number] Arrival date & time: 08/02/17  1950     History   Chief Complaint Chief Complaint  Patient presents with  . Vaginal Odor    HPI [name] is a 47 y.o. female.   [name] presents with return of vaginal odor. Was seen and treated for BV with positive finding on urine cytology 3/21. She went out of town in the middle of the course so stopped for a few days before finishing. Symptoms had improved however. Returned two days ago. No new sexual encounters. Denies significant discharge, but odor primarily . Has had in the past and was BV. Denies pain or itching. Denies abdominal pain or fevers.     ROS per HPI.      Past Medical History:  Diagnosis Date  . Abnormal Pap smear   . Anemia     Patient Active Problem List   Diagnosis Date Noted  . Bacterial vaginosis 05/12/2011    Past Surgical History:  Procedure Laterality Date  . CESAREAN SECTION    . FINGER SURGERY    . FOOT SURGERY    . KNEE SURGERY    . TONSILLECTOMY      OB History    Gravida  4   Para  4   Term  4   Preterm      AB      Living  4     SAB      TAB      Ectopic      Multiple      Live Births               Home Medications    Prior to Admission medications   Medication Sig Start Date End Date Taking? Authorizing Provider  cetirizine (ZYRTEC) 10 MG tablet Take 10 mg by mouth daily.    [provider]  clindamycin (CLEOCIN) 150 MG capsule Take 2 capsules (300 mg total) by mouth 2 (two) times daily for 7 days. 08/02/17 08/09/17  Zigmund Gottron, NP  cyclobenzaprine (FLEXERIL) 10 MG tablet Take 1 tablet (10 mg total) by mouth at bedtime as needed for muscle spasms. 02/02/17   Tasia Catchings, Amy V, PA-C  ibuprofen (ADVIL,MOTRIN) 600 MG tablet Take 600 mg by mouth every 6 (six) hours as needed. For pain    [provider]  levonorgestrel (MIRENA) 20 MCG/24HR IUD 1 each by Intrauterine route once.    [provider]   zolpidem (AMBIEN) 5 MG tablet Take 5 mg by mouth at bedtime as needed for sleep.    [provider]    Family History Family History  Problem Relation Age of Onset  . Hypertension Mother   . Hypertension Father     Social History Social History   Tobacco Use  . Smoking status: Never Smoker  . Smokeless tobacco: Never Used  Substance Use Topics  . Alcohol use: Yes    Comment: weekends socially  . Drug use: No     Allergies   Promethazine hcl   Review of Systems Review of Systems   Physical Exam Triage Vital Signs ED Triage Vitals  Enc Vitals Group     BP 08/02/17 1957 (!) 170/63     Pulse Rate 08/02/17 1957 64     Resp 08/02/17 1957 18     Temp 08/02/17 1957 98.4 F (36.9 C)     Temp src --      SpO2 08/02/17 1957 100 %  Weight --      Height --      Head Circumference --      Peak Flow --      Pain Score 08/02/17 1959 0     Pain Loc --      Pain Edu? --      Excl. in [place]? --    No data found.  Updated Vital Signs BP (!) 170/63   Pulse 64   Temp 98.4 F (36.9 C)   Resp 18   SpO2 100%   Visual Acuity Right Eye Distance:   Left Eye Distance:   Bilateral Distance:    Right Eye Near:   Left Eye Near:    Bilateral Near:     Physical Exam  Constitutional: She is oriented to person, place, and time. She appears well-developed and well-nourished. No distress.  Cardiovascular: Normal rate, regular rhythm and normal heart sounds.  Pulmonary/Chest: Effort normal and breath sounds normal.  Abdominal: Soft. There is no tenderness. There is no CVA tenderness.  Without pain or bleeding; gu exam deferred.   Neurological: She is alert and oriented to person, place, and time.  Skin: Skin is warm and dry.     UC Treatments / Results  Labs (all labs ordered are listed, but only abnormal results are displayed) Labs Reviewed  URINE CYTOLOGY ANCILLARY ONLY    EKG None Radiology No results found.  Procedures Procedures (including  critical care time)  Medications Ordered in UC Medications - No data to display   Initial Impression / Assessment and Plan / UC Course  I have reviewed the triage vital signs and the nursing notes.  Pertinent labs & imaging results that were available during my care of the patient were reviewed by me and considered in my medical decision making (see chart for details).     clinda initiated due to recent flagyl use. Urine cytology pending. Will notify of any positive findings and if any changes to treatment are needed.  Encouraged to complete entire course. If symptoms worsen or do not improve in the next week to return to be seen or to follow up with PCP.  Patient verbalized understanding and agreeable to plan.    Final Clinical Impressions(s) / UC Diagnoses   Final diagnoses:  Vaginal odor    ED Discharge Orders        Ordered    clindamycin (CLEOCIN) 150 MG capsule  2 times daily     08/02/17 2017       Controlled Substance Prescriptions [place] Controlled Substance Registry consulted? Not Applicable   Zigmund Gottron, NP 08/02/17 2025

## 2017-08-03 LAB — URINE CYTOLOGY ANCILLARY ONLY
CHLAMYDIA, DNA PROBE: NEGATIVE
Neisseria Gonorrhea: NEGATIVE
TRICH (WINDOWPATH): NEGATIVE

## 2017-08-06 ENCOUNTER — Telehealth (HOSPITAL_COMMUNITY): Payer: Self-pay

## 2017-08-06 LAB — URINE CYTOLOGY ANCILLARY ONLY: Candida vaginitis: NEGATIVE

## 2017-08-06 MED ORDER — METRONIDAZOLE 500 MG PO TABS: 500.0000 mg | ORAL_TABLET | Freq: Two times a day (BID) | ORAL | 0 refills | Status: DC

## 2017-08-06 NOTE — Telephone Encounter (Signed)
Pt contacted regarding test for gardnerella (bacterial vaginosis) being positive.  This only needs to be treated if there are persistent symptoms, such as vaginal irritation/discharge.   Symptoms are present, metronidazole 500mg  bid x 7d #14 no refills sent to pharmacy of choice. Recheck or followup with PCP for further evaluation if symptoms are not improving. Answered all questions.

## 2017-08-13 ENCOUNTER — Telehealth (HOSPITAL_COMMUNITY): Payer: Self-pay

## 2017-08-13 MED ORDER — METRONIDAZOLE 500 MG PO TABS: 500.0000 mg | ORAL_TABLET | Freq: Two times a day (BID) | ORAL | 0 refills | Status: DC

## 2018-10-15 ENCOUNTER — Encounter (HOSPITAL_COMMUNITY): Payer: Self-pay

## 2018-10-15 ENCOUNTER — Emergency Department (HOSPITAL_COMMUNITY): Payer: BC Managed Care – PPO

## 2018-10-15 ENCOUNTER — Other Ambulatory Visit: Payer: Self-pay

## 2018-10-15 ENCOUNTER — Emergency Department (HOSPITAL_COMMUNITY)
Admission: EM | Admit: 2018-10-15 | Discharge: 2018-10-15 | Disposition: A | Discharge status: Home / Self Care | Payer: BC Managed Care – PPO | Attending: Emergency Medicine | Admitting: Emergency Medicine

## 2018-10-15 DIAGNOSIS — Y929 Unspecified place or not applicable: Secondary | ICD-10-CM | POA: Diagnosis not present

## 2018-10-15 DIAGNOSIS — S060X0A Concussion without loss of consciousness, initial encounter: Secondary | ICD-10-CM | POA: Diagnosis not present

## 2018-10-15 DIAGNOSIS — R42 Dizziness and giddiness: Secondary | ICD-10-CM | POA: Diagnosis not present

## 2018-10-15 DIAGNOSIS — Y998 Other external cause status: Secondary | ICD-10-CM | POA: Diagnosis not present

## 2018-10-15 DIAGNOSIS — Y9389 Activity, other specified: Secondary | ICD-10-CM | POA: Diagnosis not present

## 2018-10-15 DIAGNOSIS — S0990XA Unspecified injury of head, initial encounter: Secondary | ICD-10-CM | POA: Diagnosis present

## 2018-10-15 MED ORDER — KETOROLAC TROMETHAMINE 60 MG/2ML IM SOLN
60.0000 mg | Freq: Once | INTRAMUSCULAR | Status: AC
  Administered 2018-10-15: 60 mg via INTRAMUSCULAR
  Filled 2018-10-15: qty 2
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
## 2018-10-15 NOTE — ED Notes (Signed)
Patient transported to CT 
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
## 2018-10-25 ENCOUNTER — Encounter: Payer: Self-pay | Admitting: Neurology

## 2018-10-25 ENCOUNTER — Ambulatory Visit (INDEPENDENT_AMBULATORY_CARE_PROVIDER_SITE_OTHER): Payer: BC Managed Care – PPO | Admitting: Neurology

## 2018-10-25 ENCOUNTER — Other Ambulatory Visit: Payer: Self-pay

## 2018-10-25 VITALS — BP 159/80 | HR 70 | Ht 62.5 in | Wt 183.0 lb

## 2018-10-25 DIAGNOSIS — H8110 Benign paroxysmal vertigo, unspecified ear: Secondary | ICD-10-CM | POA: Diagnosis not present

## 2018-10-25 DIAGNOSIS — G319 Degenerative disease of nervous system, unspecified: Secondary | ICD-10-CM

## 2018-10-25 DIAGNOSIS — G44319 Acute post-traumatic headache, not intractable: Secondary | ICD-10-CM

## 2018-10-25 NOTE — Patient Instructions (Signed)
I don't think the dizziness is related to the cerebellar shrinkage.  It is likely just how your brain looks.  The dizziness is likely inner ear dysfunction since the concussion.  Headaches should also continue to improve.  If no improvement in dizziness, I can send you to physical therapy.  If headaches don't continue to improve, we can start a daily medication.  Limit use of pain relievers to no more than 2 days out of week to prevent risk of rebound or medication-overuse headache.   Follow up in 4 months.

## 2018-11-15 ENCOUNTER — Telehealth: Payer: BC Managed Care – PPO | Admitting: Neurology

## 2019-02-13 ENCOUNTER — Encounter: Payer: Self-pay | Admitting: Neurology

## 2019-02-19 ENCOUNTER — Other Ambulatory Visit: Payer: Self-pay | Admitting: Family Medicine

## 2019-02-19 DIAGNOSIS — R109 Unspecified abdominal pain: Secondary | ICD-10-CM

## 2019-02-25 ENCOUNTER — Other Ambulatory Visit: Payer: BC Managed Care – PPO

## 2019-02-27 ENCOUNTER — Ambulatory Visit: Payer: BC Managed Care – PPO | Admitting: Neurology

## 2019-02-28 ENCOUNTER — Ambulatory Visit
Admission: RE | Admit: 2019-02-28 | Discharge: 2019-02-28 | Disposition: A | Discharge status: Home / Self Care | Payer: BC Managed Care – PPO | Source: Ambulatory Visit | Attending: Family Medicine | Admitting: Family Medicine

## 2019-02-28 DIAGNOSIS — R109 Unspecified abdominal pain: Secondary | ICD-10-CM

## 2019-02-28 MED ORDER — IOPAMIDOL (ISOVUE-300) INJECTION 61%
100.0000 mL | Freq: Once | INTRAVENOUS | Status: AC | PRN
  Administered 2019-02-28: 100 mL via INTRAVENOUS

## 2020-02-04 ENCOUNTER — Ambulatory Visit: Payer: Self-pay

## 2020-06-01 ENCOUNTER — Ambulatory Visit: Payer: Self-pay

## 2020-06-17 ENCOUNTER — Other Ambulatory Visit: Payer: Self-pay

## 2020-06-17 ENCOUNTER — Ambulatory Visit: Payer: Self-pay | Attending: Internal Medicine

## 2020-06-17 DIAGNOSIS — Z23 Encounter for immunization: Secondary | ICD-10-CM

## 2020-06-17 NOTE — Progress Notes (Signed)
   Covid-19 Vaccination Clinic  Name:  WALIDA CAJAS    MRN: [id number] DOB: 1971-03-10  06/17/2020  Ms. Scrivens was observed post Covid-19 immunization for 15 minutes without incident. She was provided with Vaccine Information Sheet and instruction to access the V-Safe system.   Ms. Alcon was instructed to call 911 with any severe reactions post vaccine: Marland Kitchen Difficulty breathing  . Swelling of face and throat  . A fast heartbeat  . A bad rash all over body  . Dizziness and weakness   Immunizations Administered    Name Date Dose VIS Date Route   PFIZER Comrnaty(Gray TOP) Covid-19 Vaccine 06/17/2020  1:42 PM 0.3 mL 04/08/2020 Intramuscular   Manufacturer: [employer]   Lot: [id number]   NDC: [phone]

## 2020-10-03 ENCOUNTER — Emergency Department (HOSPITAL_BASED_OUTPATIENT_CLINIC_OR_DEPARTMENT_OTHER): Payer: No Typology Code available for payment source

## 2020-10-03 ENCOUNTER — Other Ambulatory Visit: Payer: Self-pay

## 2020-10-03 ENCOUNTER — Encounter (HOSPITAL_BASED_OUTPATIENT_CLINIC_OR_DEPARTMENT_OTHER): Payer: Self-pay | Admitting: *Deleted

## 2020-10-03 ENCOUNTER — Emergency Department (HOSPITAL_BASED_OUTPATIENT_CLINIC_OR_DEPARTMENT_OTHER)
Admission: EM | Admit: 2020-10-03 | Discharge: 2020-10-03 | Disposition: A | Discharge status: Home / Self Care | Payer: No Typology Code available for payment source | Attending: Emergency Medicine | Admitting: Emergency Medicine

## 2020-10-03 DIAGNOSIS — R0789 Other chest pain: Secondary | ICD-10-CM | POA: Diagnosis not present

## 2020-10-03 DIAGNOSIS — S300XXA Contusion of lower back and pelvis, initial encounter: Secondary | ICD-10-CM | POA: Diagnosis not present

## 2020-10-03 DIAGNOSIS — Y92410 Unspecified street and highway as the place of occurrence of the external cause: Secondary | ICD-10-CM | POA: Insufficient documentation

## 2020-10-03 DIAGNOSIS — S3992XA Unspecified injury of lower back, initial encounter: Secondary | ICD-10-CM | POA: Diagnosis present

## 2020-10-03 DIAGNOSIS — M542 Cervicalgia: Secondary | ICD-10-CM | POA: Insufficient documentation

## 2020-10-03 DIAGNOSIS — T148XXA Other injury of unspecified body region, initial encounter: Secondary | ICD-10-CM

## 2020-10-03 MED ORDER — KETOROLAC TROMETHAMINE 30 MG/ML IJ SOLN
60.0000 mg | Freq: Once | INTRAMUSCULAR | Status: AC
  Administered 2020-10-03: 60 mg via INTRAMUSCULAR
  Filled 2020-10-03: qty 2

## 2020-10-03 MED ORDER — METHOCARBAMOL 500 MG PO TABS: 500.0000 mg | ORAL_TABLET | Freq: Two times a day (BID) | ORAL | 0 refills | Status: DC

## 2020-10-03 MED ORDER — KETOROLAC TROMETHAMINE 60 MG/2ML IM SOLN: 60.0000 mg | Freq: Once | INTRAMUSCULAR | Status: DC

## 2020-10-03 NOTE — Discharge Instructions (Addendum)
Your x-ray today was overall reassuring. Take NSAIDs or Tylenol as needed for the next week. Take this medicine with food. Take muscle relaxer at bedtime to help you sleep. This medicine makes you drowsy so do not take before driving or work Use a heating pad for sore muscles - use for 20 minutes several times a day Try gentle range of motion exercises Return for worsening symptoms
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
## 2021-08-25 IMAGING — CT CT ABD-PELV W/ CM
2 of 5 series · 12 of 46 positions shown, 14 images · IV contrast (iopamidol)
Comparison: 04/11/2011

CLINICAL DATA: LEFT lower quadrant pain for 5-6 days, sore all over

EXAM:
CT ABDOMEN AND PELVIS WITH CONTRAST
TECHNIQUE: Multidetector CT imaging of the abdomen and pelvis was performed
using the standard protocol following bolus administration of
intravenous contrast. Sagittal and coronal MPR images reconstructed
from axial data set.
CONTRAST:  100mL [id number] IOPAMIDOL ([id number]) INJECTION 61% IV.
Dilute oral contrast.

[Series 2: abd pelvis 5.00 br40 s3 axial · axial · 0.60mm/px · z∈[+1137,+1537]mm · 9 of 90 slices shown, 11 images]
[im 5/90  soft-tissue]
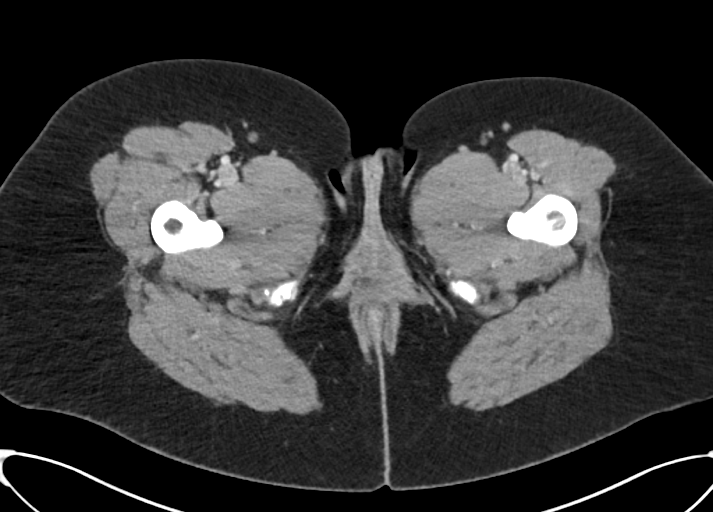
[im 5/90  bone]
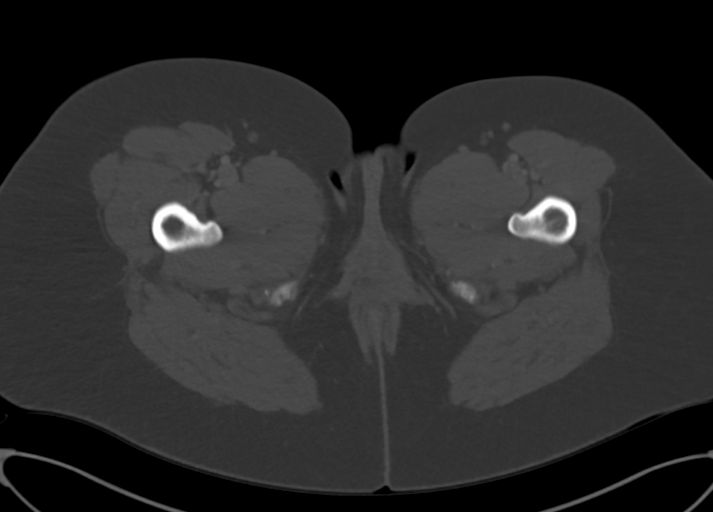
[im 15/90  soft-tissue]
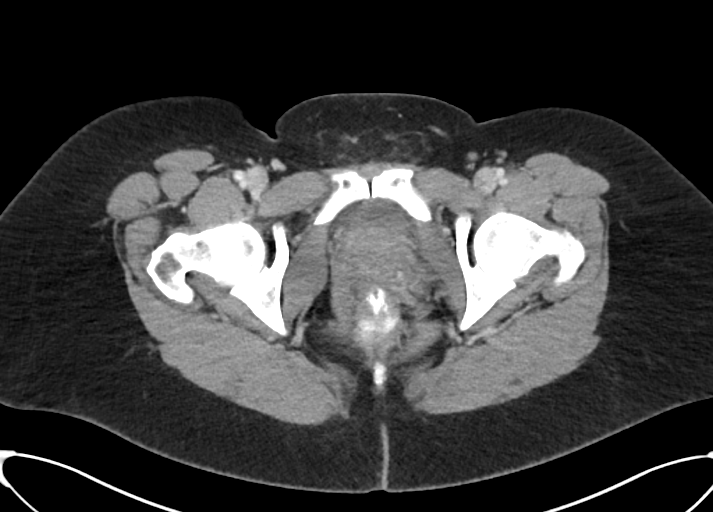
[im 25/90  soft-tissue]
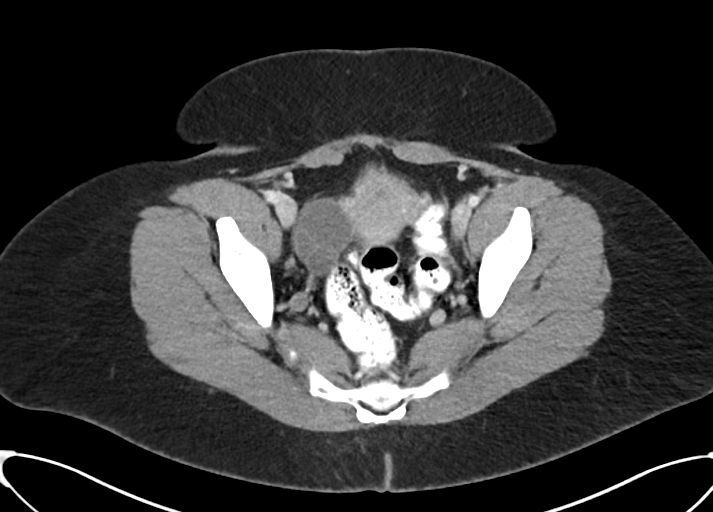
[im 35/90  soft-tissue]
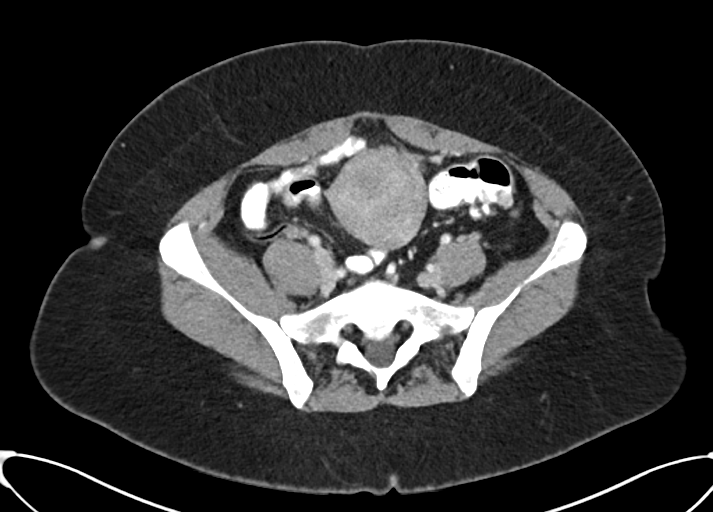
[im 45/90  soft-tissue]
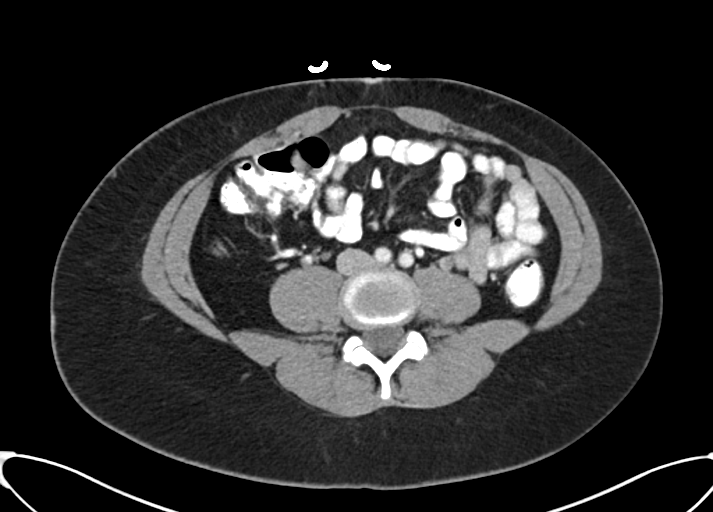
[im 55/90  soft-tissue]
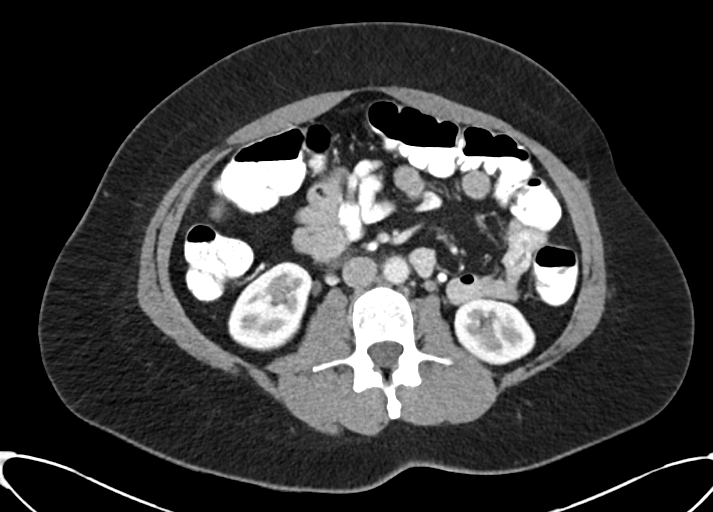
[im 65/90  soft-tissue]
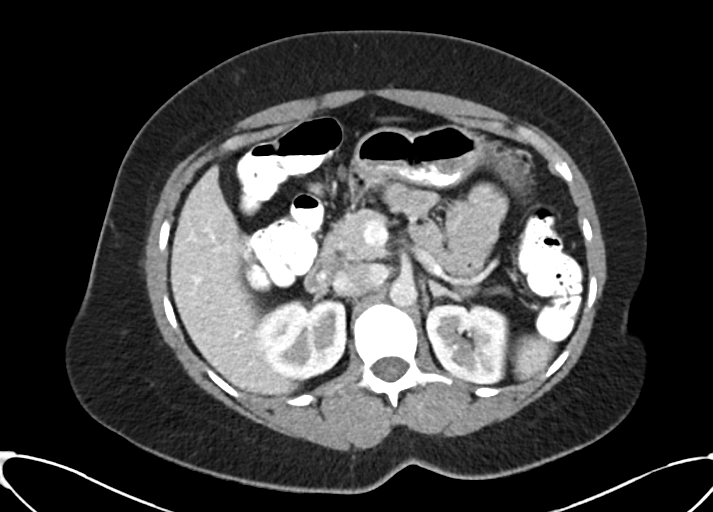
[im 75/90  soft-tissue]
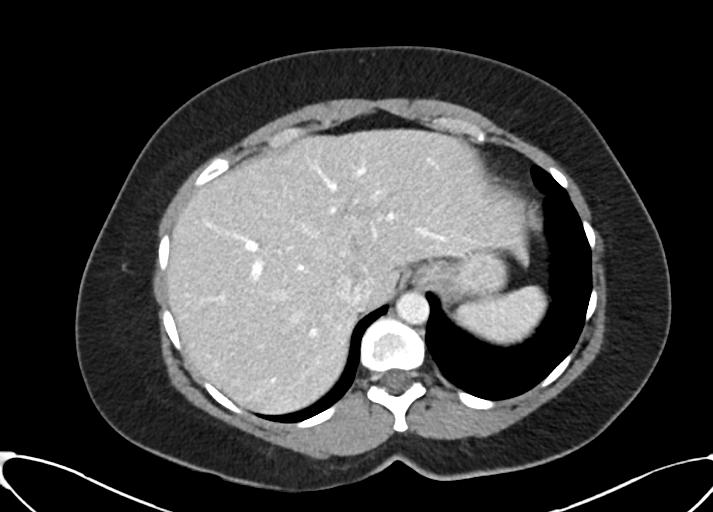
[im 85/90  soft-tissue]
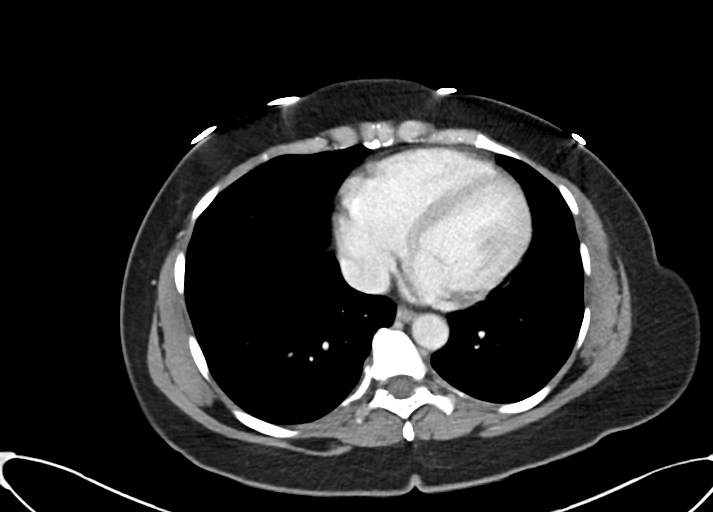
[im 85/90  bone]
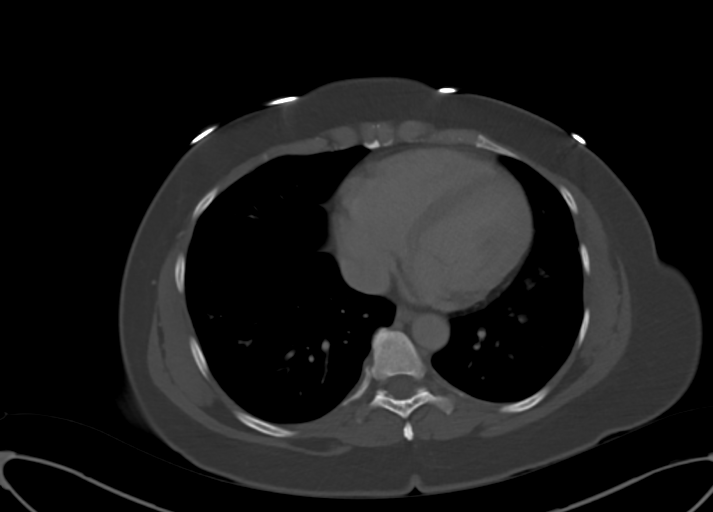

[Series 6: abd pelvis 2.00 br40 s3 cor · coronal · 0.83mm/px · 3 of 141 slices shown]
[im 47/141  soft-tissue]
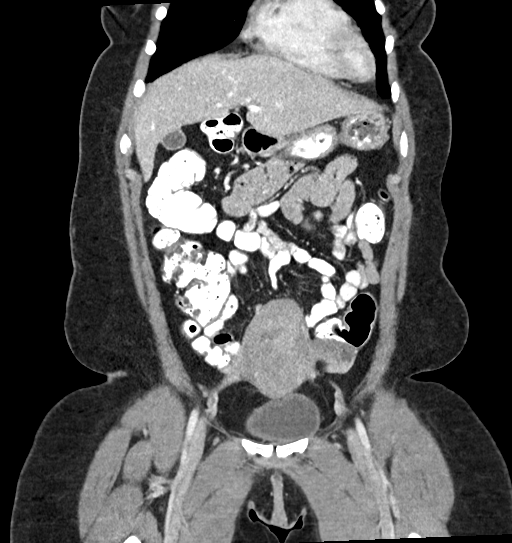
[im 63/141  soft-tissue]
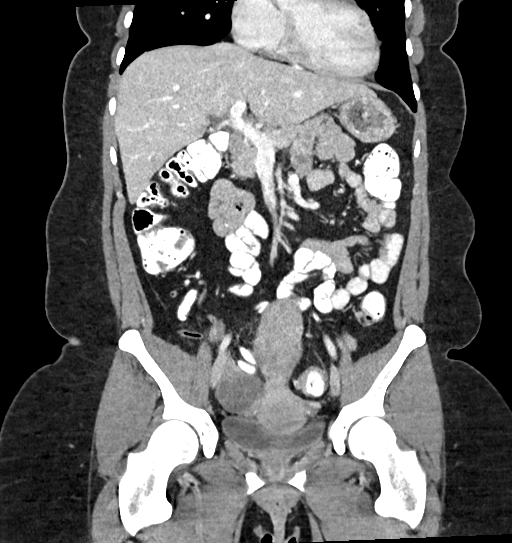
[im 78/141  soft-tissue]
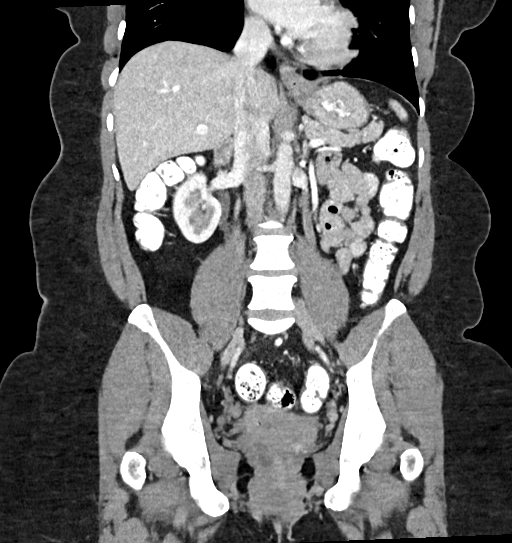

[12 of 46 positions shown; findings below may reference images not displayed]

FINDINGS: Lower chest: Lung bases clear

Hepatobiliary: Gallbladder and liver normal appearance

Pancreas: Normal appearance

Spleen: Normal appearance

Adrenals/Urinary Tract: Adrenal glands, kidneys, ureters, and
bladder normal appearance

Stomach/Bowel: Normal appendix. Stomach and bowel loops normal
appearance.

Vascular/Lymphatic: Aorta normal caliber. Vascular structures
patent. No adenopathy.

Reproductive: IUD within uterus in expected position. Subserosal
leiomyoma at uterine fundus measuring 2.7 x 2.7 cm image 52.
Septated cystic lesion of the RIGHT ovary 4.8 x 3.5 x 3.5 cm.
Unremarkable LEFT ovary.

Other: No free air or free fluid. No hernia or acute inflammatory
process.

Musculoskeletal: No acute osseous findings.
IMPRESSION: Subserosal leiomyoma at uterine fundus 2.7 cm diameter.

Septated cystic lesion of the RIGHT ovary 4.8 cm greatest size;
follow-up characterization by ultrasound recommended to exclude
cystic ovarian neoplasm.

No acute intra-abdominal or intrapelvic process identified.

These results will be called to the ordering clinician or
representative by the Radiologist Assistant, and communication
documented in the PACS or zVision Dashboard.

## 2022-05-30 ENCOUNTER — Ambulatory Visit
Admission: EM | Admit: 2022-05-30 | Discharge: 2022-05-30 | Disposition: A | Discharge status: Home / Self Care | Payer: Commercial Managed Care - HMO | Attending: Nurse Practitioner | Admitting: Nurse Practitioner

## 2022-05-30 DIAGNOSIS — B9689 Other specified bacterial agents as the cause of diseases classified elsewhere: Secondary | ICD-10-CM

## 2022-05-30 DIAGNOSIS — N760 Acute vaginitis: Secondary | ICD-10-CM

## 2022-05-30 DIAGNOSIS — Z3202 Encounter for pregnancy test, result negative: Secondary | ICD-10-CM | POA: Diagnosis not present

## 2022-05-30 HISTORY — DX: Essential (primary) hypertension: I10

## 2022-05-30 LAB — POCT URINALYSIS DIP (MANUAL ENTRY)
Bilirubin, UA: NEGATIVE
Glucose, UA: NEGATIVE mg/dL
Ketones, POC UA: NEGATIVE mg/dL
Leukocytes, UA: NEGATIVE
Nitrite, UA: NEGATIVE
Protein Ur, POC: NEGATIVE mg/dL
Spec Grav, UA: >=1.03 — AB (ref 1.010–1.025)
Urobilinogen, UA: 0.2 E.U./dL
pH, UA: 6 (ref 5.0–8.0)

## 2022-05-30 LAB — POCT URINE PREGNANCY: Preg Test, Ur: NEGATIVE

## 2022-05-30 MED ORDER — FLUCONAZOLE 150 MG PO TABS: ORAL_TABLET | ORAL | 0 refills | Status: AC

## 2022-05-30 MED ORDER — METRONIDAZOLE 500 MG PO TABS: 500.0000 mg | ORAL_TABLET | Freq: Two times a day (BID) | ORAL | 0 refills | Status: AC

## 2022-05-30 NOTE — ED Provider Notes (Signed)
[facility]    CSN: [id number] Arrival date & time: 05/30/22  1600      History   Chief Complaint Chief Complaint  Patient presents with   Urinary Frequency   Vaginal Discharge    HPI [name] is a 52 y.o. female who presents today for evaluation of vaginal discharge and itching.  The patient states that she has a history of bacterial vaginosis, she [facility]  The patient denies any new sexual partners but she did have sex 2 weeks ago, she did use a condom.  The patient denies any abdominal pain lower back pain.  She denies any urinary symptoms such as frequency or burning.  She has noticed a white vaginal discharge, she denies any bleeding, nausea or vomiting.  She denies any pain at this time.   Urinary Frequency  Vaginal Discharge Associated symptoms: no fever     Past Medical History:  Diagnosis Date   Abnormal Pap smear    Anemia    Hypertension     Patient Active Problem List   Diagnosis Date Noted   Bacterial vaginosis 05/12/2011    Past Surgical History:  Procedure Laterality Date   CESAREAN SECTION     FINGER SURGERY     FOOT SURGERY     KNEE SURGERY     TONSILLECTOMY      OB History     Gravida  4   Para  4   Term  4   Preterm      AB      Living  4      SAB      IAB      Ectopic      Multiple      Live Births               Home Medications    Prior to Admission medications   Medication Sig Start Date End Date Taking? Authorizing Provider  fluconazole (DIFLUCAN) 150 MG tablet Take 1 tablet today and one tablet in three days. 05/30/22  Yes Lattie Corns, PA-C  hydrochlorothiazide (HYDRODIURIL) 25 MG tablet Take 25 mg by mouth daily. 05/20/22  Yes [provider]  metroNIDAZOLE (FLAGYL) 500 MG tablet Take 1 tablet (500 mg total) by mouth 2 (two) times daily. 05/30/22  Yes Lattie Corns, PA-C  levonorgestrel (MIRENA) 20 MCG/24HR IUD 1  each by Intrauterine route continuous. Placed in April 2020    [provider]  zolpidem (AMBIEN) 10 MG tablet Take 10 mg by mouth at bedtime as needed for sleep.     [provider]    Family History Family History  Problem Relation Age of Onset   Hypertension Mother    Hypertension Father     Social History Social History   Tobacco Use   Smoking status: Never   Smokeless tobacco: Never  Vaping Use   Vaping Use: Never used  Substance Use Topics   Alcohol use: Yes    Comment: weekends socially   Drug use: No     Allergies   Promethazine hcl   Review of Systems Review of Systems  Constitutional:  Negative for fever.  Genitourinary:  Positive for frequency and vaginal discharge. Negative for vaginal bleeding and vaginal pain.  Musculoskeletal:  Negative for back pain.  All other systems reviewed and are negative.    Physical Exam Triage Vital Signs ED Triage Vitals  Enc Vitals  Group     BP 05/30/22 1720 (!) 145/80     Pulse Rate 05/30/22 1720 60     Resp 05/30/22 1720 18     Temp 05/30/22 1720 98 F (36.7 C)     Temp src --      SpO2 05/30/22 1720 98 %     Weight --      Height --      Head Circumference --      Peak Flow --      Pain Score 05/30/22 1718 0     Pain Loc --      Pain Edu? --      Excl. in [place]? --    No data found.  Updated Vital Signs BP (!) 145/80   Pulse 60   Temp 98 F (36.7 C)   Resp 18   SpO2 98%   Visual Acuity Right Eye Distance:   Left Eye Distance:   Bilateral Distance:    Right Eye Near:   Left Eye Near:    Bilateral Near:     Physical Exam Constitutional:      General: She is not in acute distress.    Appearance: She is not diaphoretic.  Cardiovascular:     Rate and Rhythm: Normal rate and regular rhythm.     Heart sounds: No murmur heard.    No friction rub. No gallop.  Pulmonary:     Effort: Pulmonary effort is normal.     Breath sounds: Normal breath sounds. No stridor. No wheezing or  rales.  Abdominal:     General: Abdomen is flat. Bowel sounds are normal.     Palpations: Abdomen is soft.     Tenderness: There is no abdominal tenderness. There is no right CVA tenderness, left CVA tenderness or guarding.  Lymphadenopathy:     Cervical: No cervical adenopathy.  Neurological:     Mental Status: She is alert.    UC Treatments / Results  Labs (all labs ordered are listed, but only abnormal results are displayed) Labs Reviewed  POCT URINALYSIS DIP (MANUAL ENTRY) - Abnormal; Notable for the following components:      Result Value   Spec Grav, UA >=1.030 (*)    Blood, UA small (*)    All other components within normal limits  POCT URINE PREGNANCY  CERVICOVAGINAL ANCILLARY ONLY    EKG   Radiology No results found.  Procedures Procedures (including critical care time)  Medications Ordered in UC Medications - No data to display  Initial Impression / Assessment and Plan / UC Course  I have reviewed the triage vital signs and the nursing notes.  Pertinent labs & imaging results that were available during my care of the patient were reviewed by me and considered in my medical decision making (see chart for details).     1.  Treatment options were discussed today with the patient. 2.  Urinalysis negative for obvious signs of urinary tract infection.  Urine pregnancy test is negative. 3.  Cytology has been ordered for evaluation of BV, trichomonas in addition to chlamydia and gonorrhea.  Will treat empirically for BV, prescription for Flagyl in addition to Diflucan has been sent into the patient's pharmacy. 4.  Will contact the patient later this week with the formal results. Final Clinical Impressions(s) / UC Diagnoses   Final diagnoses:  Bacterial vaginosis     Discharge Instructions      -Take medications as prescribed. -Test results should be available in a  few days. -Follow-up if no improvement.    ED Prescriptions     Medication Sig Dispense  Auth. Provider   metroNIDAZOLE (FLAGYL) 500 MG tablet Take 1 tablet (500 mg total) by mouth 2 (two) times daily. 14 tablet Lattie Corns, PA-C   fluconazole (DIFLUCAN) 150 MG tablet Take 1 tablet today and one tablet in three days. 2 tablet Lattie Corns, PA-C      PDMP not reviewed this encounter.   Lattie Corns, [place] 05/30/22 1757

## 2022-05-30 NOTE — ED Triage Notes (Signed)
Patient to Urgent Care with complaints of vaginal discharge. Denies any odor. Reports some urinary urgency. Concerns about possible BV.  Symptoms started 1.5-2 weeks ago.  Denies any known fevers.

## 2022-05-30 NOTE — Discharge Instructions (Addendum)
-  Take medications as prescribed. -Test results should be available in a few days. -Follow-up if no improvement.

## 2022-05-31 LAB — CERVICOVAGINAL ANCILLARY ONLY
Bacterial Vaginitis (gardnerella): POSITIVE — AB
Candida Glabrata: NEGATIVE
Candida Vaginitis: POSITIVE — AB
Chlamydia: NEGATIVE
Comment: NEGATIVE
Comment: NEGATIVE
Comment: NEGATIVE
Comment: NEGATIVE
Comment: NEGATIVE
Comment: NORMAL
Neisseria Gonorrhea: NEGATIVE
Trichomonas: NEGATIVE
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
## 2023-11-27 ENCOUNTER — Telehealth: Payer: Self-pay

## 2023-11-27 NOTE — Telephone Encounter (Signed)
 Returned her call, she had inquired about PREP program; explained PREP, would like to attend at [place], works nights at [employer]; will ask Luke to contact her with Dorise schedule of classes for Aug/Sept .

## 2023-11-28 ENCOUNTER — Telehealth: Payer: Self-pay

## 2023-11-28 NOTE — Telephone Encounter (Signed)
 Talked with her about next PREP class, tentatively planned for 12/24/2023. Wants morning  I will be back in touch with her about the date.

## 2023-11-28 NOTE — Telephone Encounter (Signed)
 Left a message about the PREP program and the upcoming class. Asked her to return call.

## 2023-12-18 NOTE — Progress Notes (Signed)
 [facility] Weekly Session  Patient Details  Name: [name] MRN: [id number] Date of Birth: 1971-05-01 Age: 53 y.o. PCP: Ilah Crigler, MD  Vitals:   12/18/23 1600  Weight: 189 lb 6.4 oz (85.9 kg)     [facility] Weekly seesion - 12/18/23 1600       [facility] PREP Location   [facility] PREP Location [facility]      Weekly Session   Topic Discussed Goal setting and welcome to the program    Classes attended to date 1          [facility] 12/18/2023, 4:01 PM

## 2023-12-18 NOTE — Progress Notes (Signed)
 [facility] PREP Evaluation  Patient Details  Name: [name] MRN: [id number] Date of Birth: 1970-09-27 Age: 53 y.o. PCP: Ilah Crigler, MD  Vitals:   12/18/23 1549  BP: (!) 189/97  Pulse: (!) 53  SpO2: 98%  Weight: 189 lb (85.7 kg)     [facility] Eval - 12/18/23 1500       [facility] PREP Location   [facility] PREP Location [facility]      Referral    Referring Provider Self    Reason for referral Hypertension;Obesitity/Overweight    Program Start Date 12/18/23      Measurement   Waist Circumference 43.75 inches    Hip Circumference 46.5 inches    Body fat 42.2 percent      Information for Trainer   Goals --   Lose belly fat, gain muscle, get into an active fit condition   Current Exercise Some gym classes    Orthopedic Concerns Hips, back    Current Barriers None      Mobility and Daily Activities   I find it easy to walk up or down two or more flights of stairs. 2    I have no trouble taking out the trash. 4    I do housework such as vacuuming and dusting on my own without difficulty. 4    I can easily lift a gallon of milk (8lbs). 4    I can easily walk a mile. 4    I have no trouble reaching into high cupboards or reaching down to pick up something from the floor. 2    I do not have trouble doing out-door work such as Loss adjuster, chartered, raking leaves, or gardening. 4      Mobility and Daily Activities   I feel younger than my age. 2    I feel independent. 4    I feel energetic. 2    I live an active life.  2    I feel strong. 2    I feel healthy. 2    I feel active as other people my age. 2      How fit and strong are you.   Fit and Strong Total Score 40         Past Medical History:  Diagnosis Date   Abnormal Pap smear    Anemia    Hypertension    Past Surgical History:  Procedure Laterality Date   CESAREAN SECTION     FINGER SURGERY     FOOT SURGERY     KNEE SURGERY     TONSILLECTOMY     Social History   Tobacco Use  Smoking Status Never   Smokeless Tobacco Never  Sydne is ready to start PREP on August 19th at 2:30  [facility] 12/18/2023, 3:52 PM

## 2023-12-25 NOTE — Progress Notes (Signed)
 [facility] PRE[facility] Weekly Session  Patient Details  Name: [name] MRN: [id number] Date of Birth: 1970/06/07 Age: 53 y.o. PCP: Ilah Crigler, MD  There were no vitals filed for this visit.   [facility] Weekly seesion - 12/25/23 1500       [facility] PREP Location   [facility] PREP Location [facility]      Weekly Session   Topic Discussed Other ways to be active   Talked about the difference between cardio, strength training, and stretching and recommended time amounts.   Classes attended to date 3          Suzen Ash 12/25/2023, 3:16 PM

## 2024-01-01 NOTE — Progress Notes (Signed)
 [facility] PRE[facility] Weekly Session  Patient Details  Name: [name] MRN: [id number] Date of Birth: 11/17/1970 Age: 53 y.o. PCP: Ilah Crigler, MD  There were no vitals filed for this visit.   [facility] Weekly seesion - 01/01/24 1500       [facility] PREP Location   [facility] PREP Location [facility]      Weekly Session   Topic Discussed Healthy eating tips   Talked about foods to increase and avoid including fats and sugar. Went over [employer] exercised this week 170 minutes    Classes attended to date 5          [name] 01/01/2024, 3:52 PM

## 2024-01-15 NOTE — Progress Notes (Signed)
 [facility] PRE[facility] Weekly Session  Patient Details  Name: [name] MRN: [id number] Date of Birth: [birth year] Age: 53 y.o. PCP: Ilah Crigler, MD  There were no vitals filed for this visit.   [facility] Weekly seesion - 01/15/24 1600       [facility] PREP Location   [facility] PREP Location [facility]      Weekly Session   Topic Discussed Health habits   Talked about healthy tips and habits, sugar demo   Classes attended to date 7          [facility] 01/15/2024, 4:02 PM

## 2024-01-29 NOTE — Progress Notes (Signed)
 [facility] PRE[facility] Weekly Session  Patient Details  Name: [name] MRN: [id number] Date of Birth: Feb 23, 1971 Age: 53 y.o. PCP: Ilah Crigler, MD  There were no vitals filed for this visit.   [facility] Weekly seesion - 01/29/24 1500       [facility] PREP Location   [facility] PREP Location [facility]      Weekly Session   Topic Discussed Stress management and problem solving   Stress management and sleep tips   Minutes exercised this week 335 minutes    Classes attended to date [address] 01/29/2024, 3:33 PM
